# Patient Record
Sex: Male | Born: 1975 | Race: Black or African American | Hispanic: No | Marital: Single | Smoking: Never smoker
Health system: Southern US, Community
[De-identification: ages and names within clinical notes are randomized; demographics above are authoritative.]

## PROBLEM LIST (undated history)

## (undated) ENCOUNTER — Ambulatory Visit (HOSPITAL_COMMUNITY): Admission: EM | Payer: Medicare Other

## (undated) DIAGNOSIS — F840 Autistic disorder: Secondary | ICD-10-CM

## (undated) DIAGNOSIS — E669 Obesity, unspecified: Secondary | ICD-10-CM

## (undated) DIAGNOSIS — E785 Hyperlipidemia, unspecified: Secondary | ICD-10-CM

## (undated) DIAGNOSIS — G4730 Sleep apnea, unspecified: Secondary | ICD-10-CM

## (undated) DIAGNOSIS — I10 Essential (primary) hypertension: Secondary | ICD-10-CM

## (undated) DIAGNOSIS — E119 Type 2 diabetes mellitus without complications: Secondary | ICD-10-CM

## (undated) DIAGNOSIS — F418 Other specified anxiety disorders: Secondary | ICD-10-CM

---

## 2002-08-22 ENCOUNTER — Emergency Department (HOSPITAL_COMMUNITY): Admission: EM | Admit: 2002-08-22 | Discharge: 2002-08-22 | Payer: Self-pay | Admitting: Emergency Medicine

## 2014-05-08 ENCOUNTER — Encounter (HOSPITAL_BASED_OUTPATIENT_CLINIC_OR_DEPARTMENT_OTHER): Payer: Medicaid Other

## 2014-07-03 ENCOUNTER — Ambulatory Visit (HOSPITAL_BASED_OUTPATIENT_CLINIC_OR_DEPARTMENT_OTHER): Payer: Medicaid Other | Attending: Internal Medicine

## 2014-07-03 ENCOUNTER — Emergency Department (HOSPITAL_COMMUNITY): Admission: EM | Admit: 2014-07-03 | Payer: Medicaid Other | Source: Home / Self Care

## 2014-07-04 NOTE — Sleep Study (Unsigned)
The patient arrived to the Sleep center with his caregiver at 2030. The patient appeared loud, disruptive and agitated. The caregiver explained this was the patient's actions when presented to an unfamiliar environment. As I assessed the patient and talked with the caregiver, the patient continued to become more and more agitated and loud. The caregiver was asked how the patient would respond to touch and the caregiver's response was the patient might not let the technologist touch him, but the caregiver was there to keep him calm since he was familiar with the patient. With this given information, the sleep study was not performed due to the patient's current status. A Home Sleep test may be more beneficial as a baseline so that the patient will be in a familiar environment.

## 2015-10-27 ENCOUNTER — Ambulatory Visit (HOSPITAL_BASED_OUTPATIENT_CLINIC_OR_DEPARTMENT_OTHER): Payer: Medicare Other | Attending: Internal Medicine | Admitting: *Deleted

## 2015-10-27 VITALS — Ht 71.0 in | Wt 261.0 lb

## 2015-10-27 DIAGNOSIS — G4733 Obstructive sleep apnea (adult) (pediatric): Secondary | ICD-10-CM | POA: Insufficient documentation

## 2015-10-27 DIAGNOSIS — R0683 Snoring: Secondary | ICD-10-CM | POA: Diagnosis not present

## 2015-11-01 DIAGNOSIS — G4733 Obstructive sleep apnea (adult) (pediatric): Secondary | ICD-10-CM | POA: Diagnosis not present

## 2015-11-01 NOTE — Progress Notes (Signed)
Patient Name: Casey Fernandez, Casey Fernandez Date: 10/27/2015 Gender: Male D.O.B: 1976/05/23 Age (years): 39 Referring Provider: Nolene Ebbs Height (inches): 71 Interpreting Physician: Baird Lyons MD, ABSM Weight (lbs): 261 RPSGT: Joni Reining BMI: 78 MRN: [id number] Neck Size: 17.50 CLINICAL INFORMATION The patient is referred for a split night study with CPAP/BPAP.   MEDICATIONS Medications taken by the patient : charted for review Medications administered by patient during sleep study : No sleep medicine administered.  SLEEP STUDY TECHNIQUE As per the AASM Manual for the Scoring of Sleep and Associated Events v2.3 (April 2016) with a hypopnea requiring 4% desaturations. The channels recorded and monitored were frontal, central and occipital EEG, electrooculogram (EOG), submentalis EMG (chin), nasal and oral airflow, thoracic and abdominal wall motion, anterior tibialis EMG, snore microphone, electrocardiogram, and pulse oximetry. Bi-level positive airway pressure (BiPAP) was initiated when the patient met split night criteria and was titrated according to treat sleep-disordered breathing.  RESPIRATORY PARAMETERS Diagnostic Total AHI (/hr): 122.4 RDI (/hr): 122.4 OA Index (/hr): 119.6 CA Index (/hr): 0.0 REM AHI (/hr): N/A NREM AHI (/hr): 122.4 Supine AHI (/hr): 122.4 Non-supine AHI (/hr): N/A Min O2 Sat (%): 75.00 Mean O2 (%): 90.07 Time below 88% (min): 50.1     Titration Optimal IPAP Pressure (cm): 18 Optimal EPAP Pressure (cm): 12 AHI at Optimal Pressure (/hr): 64.4 Min O2 at Optimal Pressure (%): 83.0 Sleep % at Optimal (%): 87 Supine % at Optimal (%): 100          SLEEP ARCHITECTURE The study was initiated at 10:31:25 PM and terminated at 5:03:25 AM. The total recorded time was 392.0 minutes. EEG confirmed total sleep time was 251.0 minutes yielding a sleep efficiency of 64.0%. Sleep onset after lights out was 1.3 minutes with a REM latency of 387.0 minutes. The patient  spent 18.33% of the night in stage N1 sleep, 80.68% in stage N2 sleep, 0.00% in stage N3 and 1.00% in REM. Wake after sleep onset (WASO) was 139.7 minutes. The Arousal Index was 91.8/hour.  LEG MOVEMENT DATA The total Periodic Limb Movements of Sleep (PLMS) were 0. The PLMS index was 0.00 .  CARDIAC DATA The 2 lead EKG demonstrated sinus rhythm. The mean heart rate was 81.69 beats per minute. Other EKG findings include: None.  IMPRESSIONS - Patient unable to understand directions- see tech notes. Caregiver assisted. - Severe obstructive sleep apnea occurred during the diagnostic portion of the study (AHI = 122.4 /hour).  An optimal CPAP pressure was selected for this patient ( 18 / cm of water) for initial home trial. BiPAP was tried by tech. Suggest initial trial of CPAP at home. - No significant central sleep apnea occurred during the diagnostic portion of the study (CAI = 0.0/hour). - Moderate oxygen desaturation was noted during the diagnostic portion of the study (Min O2 = 75.00%). - The patient snored with Moderate snoring volume during the diagnostic portion of the study. - No cardiac abnormalities were noted during this study. - Clinically significant periodic limb movements of sleep did not occur during the study.  DIAGNOSIS - Obstructive Sleep Apnea (327.23 [G47.33 ICD-10])  RECOMMENDATIONS - Trial of CPAP therapy on 18/0 cm H2O with a Medium size Fisher[employer]Paykel Full Face Mask Simplus mask and heated humidification. - See Impressions, above. - Avoid alcohol, sedatives and other CNS depressants that may worsen sleep apnea and disrupt normal sleep architecture. - Sleep hygiene should be reviewed to assess factors that may improve sleep quality. - Weight management and regular exercise should be  initiated or continued.  Deneise Lever Diplomate, American Board of Sleep Medicine  ELECTRONICALLY SIGNED ON:  11/01/2015, 10:13 AM [facility] PH: [phone]  [phone]   FX: [phone] [place]

## 2019-07-25 ENCOUNTER — Other Ambulatory Visit: Payer: Self-pay

## 2019-07-25 ENCOUNTER — Inpatient Hospital Stay (HOSPITAL_COMMUNITY)
Admission: EM | Admit: 2019-07-25 | Discharge: 2019-07-31 | DRG: 208 | Disposition: A | Discharge status: Home / Self Care | Payer: Medicare Other | Attending: Internal Medicine | Admitting: Internal Medicine

## 2019-07-25 ENCOUNTER — Emergency Department (HOSPITAL_COMMUNITY): Payer: Medicare Other

## 2019-07-25 ENCOUNTER — Encounter (HOSPITAL_COMMUNITY): Payer: Self-pay | Admitting: Internal Medicine

## 2019-07-25 ENCOUNTER — Inpatient Hospital Stay (HOSPITAL_COMMUNITY): Payer: Medicare Other

## 2019-07-25 DIAGNOSIS — E872 Acidosis: Secondary | ICD-10-CM | POA: Diagnosis present

## 2019-07-25 DIAGNOSIS — E861 Hypovolemia: Secondary | ICD-10-CM | POA: Diagnosis present

## 2019-07-25 DIAGNOSIS — E878 Other disorders of electrolyte and fluid balance, not elsewhere classified: Secondary | ICD-10-CM | POA: Diagnosis present

## 2019-07-25 DIAGNOSIS — R1310 Dysphagia, unspecified: Secondary | ICD-10-CM | POA: Diagnosis present

## 2019-07-25 DIAGNOSIS — F329 Major depressive disorder, single episode, unspecified: Secondary | ICD-10-CM | POA: Diagnosis present

## 2019-07-25 DIAGNOSIS — E1165 Type 2 diabetes mellitus with hyperglycemia: Secondary | ICD-10-CM | POA: Diagnosis not present

## 2019-07-25 DIAGNOSIS — D6489 Other specified anemias: Secondary | ICD-10-CM | POA: Diagnosis present

## 2019-07-25 DIAGNOSIS — D638 Anemia in other chronic diseases classified elsewhere: Secondary | ICD-10-CM | POA: Diagnosis present

## 2019-07-25 DIAGNOSIS — Z452 Encounter for adjustment and management of vascular access device: Secondary | ICD-10-CM

## 2019-07-25 DIAGNOSIS — E785 Hyperlipidemia, unspecified: Secondary | ICD-10-CM | POA: Diagnosis present

## 2019-07-25 DIAGNOSIS — E870 Hyperosmolality and hypernatremia: Secondary | ICD-10-CM | POA: Diagnosis not present

## 2019-07-25 DIAGNOSIS — I119 Hypertensive heart disease without heart failure: Secondary | ICD-10-CM | POA: Diagnosis present

## 2019-07-25 DIAGNOSIS — G9341 Metabolic encephalopathy: Secondary | ICD-10-CM | POA: Diagnosis present

## 2019-07-25 DIAGNOSIS — U071 COVID-19: Secondary | ICD-10-CM | POA: Diagnosis present

## 2019-07-25 DIAGNOSIS — Z6837 Body mass index (BMI) 37.0-37.9, adult: Secondary | ICD-10-CM

## 2019-07-25 DIAGNOSIS — F418 Other specified anxiety disorders: Secondary | ICD-10-CM | POA: Diagnosis present

## 2019-07-25 DIAGNOSIS — N179 Acute kidney failure, unspecified: Secondary | ICD-10-CM | POA: Diagnosis present

## 2019-07-25 DIAGNOSIS — A419 Sepsis, unspecified organism: Secondary | ICD-10-CM | POA: Diagnosis not present

## 2019-07-25 DIAGNOSIS — Z781 Physical restraint status: Secondary | ICD-10-CM

## 2019-07-25 DIAGNOSIS — T380X5A Adverse effect of glucocorticoids and synthetic analogues, initial encounter: Secondary | ICD-10-CM | POA: Diagnosis not present

## 2019-07-25 DIAGNOSIS — G4733 Obstructive sleep apnea (adult) (pediatric): Secondary | ICD-10-CM | POA: Diagnosis present

## 2019-07-25 DIAGNOSIS — I959 Hypotension, unspecified: Secondary | ICD-10-CM | POA: Diagnosis present

## 2019-07-25 DIAGNOSIS — E871 Hypo-osmolality and hyponatremia: Secondary | ICD-10-CM | POA: Diagnosis present

## 2019-07-25 DIAGNOSIS — I10 Essential (primary) hypertension: Secondary | ICD-10-CM | POA: Diagnosis present

## 2019-07-25 DIAGNOSIS — F419 Anxiety disorder, unspecified: Secondary | ICD-10-CM | POA: Diagnosis present

## 2019-07-25 DIAGNOSIS — J9690 Respiratory failure, unspecified, unspecified whether with hypoxia or hypercapnia: Secondary | ICD-10-CM

## 2019-07-25 DIAGNOSIS — E669 Obesity, unspecified: Secondary | ICD-10-CM | POA: Diagnosis present

## 2019-07-25 DIAGNOSIS — Z794 Long term (current) use of insulin: Secondary | ICD-10-CM | POA: Diagnosis not present

## 2019-07-25 DIAGNOSIS — G4730 Sleep apnea, unspecified: Secondary | ICD-10-CM | POA: Diagnosis present

## 2019-07-25 DIAGNOSIS — E119 Type 2 diabetes mellitus without complications: Secondary | ICD-10-CM

## 2019-07-25 DIAGNOSIS — F840 Autistic disorder: Secondary | ICD-10-CM | POA: Diagnosis present

## 2019-07-25 DIAGNOSIS — J9601 Acute respiratory failure with hypoxia: Secondary | ICD-10-CM | POA: Diagnosis present

## 2019-07-25 DIAGNOSIS — Z79899 Other long term (current) drug therapy: Secondary | ICD-10-CM

## 2019-07-25 DIAGNOSIS — Z7984 Long term (current) use of oral hypoglycemic drugs: Secondary | ICD-10-CM

## 2019-07-25 DIAGNOSIS — J1289 Other viral pneumonia: Secondary | ICD-10-CM | POA: Diagnosis present

## 2019-07-25 DIAGNOSIS — R0902 Hypoxemia: Secondary | ICD-10-CM | POA: Diagnosis present

## 2019-07-25 DIAGNOSIS — J1282 Pneumonia due to coronavirus disease 2019: Secondary | ICD-10-CM

## 2019-07-25 HISTORY — DX: Other specified anxiety disorders: F41.8

## 2019-07-25 HISTORY — DX: Essential (primary) hypertension: I10

## 2019-07-25 HISTORY — DX: Obesity, unspecified: E66.9

## 2019-07-25 HISTORY — DX: Autistic disorder: F84.0

## 2019-07-25 HISTORY — DX: Sleep apnea, unspecified: G47.30

## 2019-07-25 HISTORY — DX: Type 2 diabetes mellitus without complications: E11.9

## 2019-07-25 HISTORY — DX: Hyperlipidemia, unspecified: E78.5

## 2019-07-25 LAB — COMPREHENSIVE METABOLIC PANEL
ALT: 48 U/L — ABNORMAL HIGH (ref 0–44)
AST: 50 U/L — ABNORMAL HIGH (ref 15–41)
Albumin: 3.2 g/dL — ABNORMAL LOW (ref 3.5–5.0)
Alkaline Phosphatase: 77 U/L (ref 38–126)
Anion gap: 10 (ref 5–15)
BUN: 45 mg/dL — ABNORMAL HIGH (ref 6–20)
CO2: 18 mmol/L — ABNORMAL LOW (ref 22–32)
Calcium: 8.6 mg/dL — ABNORMAL LOW (ref 8.9–10.3)
Chloride: 102 mmol/L (ref 98–111)
Creatinine, Ser: 3.16 mg/dL — ABNORMAL HIGH (ref 0.61–1.24)
GFR calc Af Amer: 27 mL/min — ABNORMAL LOW (ref >60)
GFR calc non Af Amer: 23 mL/min — ABNORMAL LOW (ref >60)
Glucose, Bld: 199 mg/dL — ABNORMAL HIGH (ref 70–99)
Potassium: 4 mmol/L (ref 3.5–5.1)
Sodium: 130 mmol/L — ABNORMAL LOW (ref 135–145)
Total Bilirubin: 0.7 mg/dL (ref 0.3–1.2)
Total Protein: 7.6 g/dL (ref 6.5–8.1)

## 2019-07-25 LAB — CBC WITH DIFFERENTIAL/PLATELET
Abs Immature Granulocytes: 0.05 10*3/uL (ref 0.00–0.07)
Basophils Absolute: 0 10*3/uL (ref 0.0–0.1)
Basophils Relative: 0 %
Eosinophils Absolute: 0 10*3/uL (ref 0.0–0.5)
Eosinophils Relative: 0 %
HCT: 42.8 % (ref 39.0–52.0)
Hemoglobin: 13.6 g/dL (ref 13.0–17.0)
Immature Granulocytes: 1 %
Lymphocytes Relative: 11 %
Lymphs Abs: 0.6 10*3/uL — ABNORMAL LOW (ref 0.7–4.0)
MCH: 30.2 pg (ref 26.0–34.0)
MCHC: 31.8 g/dL (ref 30.0–36.0)
MCV: 94.9 fL (ref 80.0–100.0)
Monocytes Absolute: 0.5 10*3/uL (ref 0.1–1.0)
Monocytes Relative: 9 %
Neutro Abs: 4.2 10*3/uL (ref 1.7–7.7)
Neutrophils Relative %: 79 %
Platelets: 194 10*3/uL (ref 150–400)
RBC: 4.51 MIL/uL (ref 4.22–5.81)
RDW: 13.1 % (ref 11.5–15.5)
WBC: 5.3 10*3/uL (ref 4.0–10.5)
nRBC: 0 % (ref 0.0–0.2)

## 2019-07-25 LAB — LIPID PANEL
Cholesterol: 99 mg/dL (ref 0–200)
HDL: 17 mg/dL — ABNORMAL LOW (ref >40)
LDL Cholesterol: 45 mg/dL (ref 0–99)
Total CHOL/HDL Ratio: 5.8 RATIO
Triglycerides: 186 mg/dL — ABNORMAL HIGH (ref <150)
VLDL: 37 mg/dL (ref 0–40)

## 2019-07-25 LAB — ABO/RH: ABO/RH(D): A POS

## 2019-07-25 LAB — LACTATE DEHYDROGENASE: LDH: 265 U/L — ABNORMAL HIGH (ref 98–192)

## 2019-07-25 LAB — PROCALCITONIN: Procalcitonin: 0.29 ng/mL

## 2019-07-25 LAB — HEMOGLOBIN A1C
Hgb A1c MFr Bld: 7.5 % — ABNORMAL HIGH (ref 4.8–5.6)
Mean Plasma Glucose: 168.55 mg/dL

## 2019-07-25 LAB — PROTIME-INR
INR: 1.1 (ref 0.8–1.2)
Prothrombin Time: 14.3 seconds (ref 11.4–15.2)

## 2019-07-25 LAB — APTT: aPTT: 33 seconds (ref 24–36)

## 2019-07-25 LAB — BRAIN NATRIURETIC PEPTIDE: B Natriuretic Peptide: 20 pg/mL (ref 0.0–100.0)

## 2019-07-25 LAB — TROPONIN I (HIGH SENSITIVITY): Troponin I (High Sensitivity): 40 ng/L — ABNORMAL HIGH (ref <18)

## 2019-07-25 LAB — LACTIC ACID, PLASMA
Lactic Acid, Venous: 1.4 mmol/L (ref 0.5–1.9)
Lactic Acid, Venous: 0.8 mmol/L (ref 0.5–1.9)

## 2019-07-25 LAB — FERRITIN: Ferritin: 294 ng/mL (ref 24–336)

## 2019-07-25 LAB — C-REACTIVE PROTEIN: CRP: 14.5 mg/dL — ABNORMAL HIGH (ref <1.0)

## 2019-07-25 LAB — TRIGLYCERIDES: Triglycerides: 193 mg/dL — ABNORMAL HIGH (ref <150)

## 2019-07-25 LAB — FIBRINOGEN: Fibrinogen: 728 mg/dL — ABNORMAL HIGH (ref 210–475)

## 2019-07-25 LAB — D-DIMER, QUANTITATIVE: D-Dimer, Quant: 1.37 ug/mL-FEU — ABNORMAL HIGH (ref 0.00–0.50)

## 2019-07-25 MED ORDER — INSULIN ASPART 100 UNIT/ML ~~LOC~~ SOLN
0.0000 [IU] | Freq: Every day | SUBCUTANEOUS | Status: DC
  Administered 2019-07-26: 2 [IU] via SUBCUTANEOUS

## 2019-07-25 MED ORDER — ONDANSETRON HCL 4 MG PO TABS: 4.0000 mg | ORAL_TABLET | Freq: Four times a day (QID) | ORAL | Status: DC | PRN

## 2019-07-25 MED ORDER — HYDROCOD POLST-CPM POLST ER 10-8 MG/5ML PO SUER: 5.0000 mL | Freq: Two times a day (BID) | ORAL | Status: DC | PRN

## 2019-07-25 MED ORDER — ACETAMINOPHEN 325 MG PO TABS
650.0000 mg | ORAL_TABLET | Freq: Four times a day (QID) | ORAL | Status: DC | PRN
  Administered 2019-07-25: 650 mg via ORAL
  Filled 2019-07-25: qty 2

## 2019-07-25 MED ORDER — ETOMIDATE 2 MG/ML IV SOLN: 35.0000 mg | Freq: Once | INTRAVENOUS | Status: DC

## 2019-07-25 MED ORDER — ACETAMINOPHEN 650 MG RE SUPP
650.0000 mg | Freq: Once | RECTAL | Status: AC
  Administered 2019-07-25: 650 mg via RECTAL
  Filled 2019-07-25: qty 1

## 2019-07-25 MED ORDER — METHYLPREDNISOLONE SODIUM SUCC 125 MG IJ SOLR
59.5000 mg | Freq: Two times a day (BID) | INTRAMUSCULAR | Status: DC
  Administered 2019-07-26: 59.5 mg via INTRAVENOUS
  Filled 2019-07-25: qty 2

## 2019-07-25 MED ORDER — SODIUM CHLORIDE 0.9 % IV SOLN
INTRAVENOUS | Status: DC
  Administered 2019-07-25: 20:00:00 via INTRAVENOUS
  Administered 2019-07-25: 1000 mL via INTRAVENOUS
  Administered 2019-07-26 – 2019-07-27 (×2): via INTRAVENOUS

## 2019-07-25 MED ORDER — FENTANYL CITRATE (PF) 100 MCG/2ML IJ SOLN
100.0000 ug | INTRAMUSCULAR | Status: DC | PRN
  Filled 2019-07-25 (×2): qty 2

## 2019-07-25 MED ORDER — GUAIFENESIN-DM 100-10 MG/5ML PO SYRP: 10.0000 mL | ORAL_SOLUTION | ORAL | Status: DC | PRN

## 2019-07-25 MED ORDER — SUCCINYLCHOLINE CHLORIDE 20 MG/ML IJ SOLN
176.0000 mg | Freq: Once | INTRAMUSCULAR | Status: DC
  Filled 2019-07-25: qty 8.8

## 2019-07-25 MED ORDER — ENOXAPARIN SODIUM 60 MG/0.6ML ~~LOC~~ SOLN
60.0000 mg | SUBCUTANEOUS | Status: DC
  Administered 2019-07-26 – 2019-07-30 (×6): 60 mg via SUBCUTANEOUS
  Filled 2019-07-25 (×6): qty 0.6

## 2019-07-25 MED ORDER — INSULIN ASPART 100 UNIT/ML ~~LOC~~ SOLN: 0.0000 [IU] | Freq: Three times a day (TID) | SUBCUTANEOUS | Status: DC

## 2019-07-25 MED ORDER — ETOMIDATE 2 MG/ML IV SOLN
INTRAVENOUS | Status: AC | PRN
  Administered 2019-07-25: 35.37 mg via INTRAVENOUS

## 2019-07-25 MED ORDER — PROPOFOL 1000 MG/100ML IV EMUL
5.0000 ug/kg/min | INTRAVENOUS | Status: DC
  Administered 2019-07-25: 23:00:00 5 ug/kg/min via INTRAVENOUS
  Administered 2019-07-26: 15 ug/kg/min via INTRAVENOUS
  Administered 2019-07-26: 30 ug/kg/min via INTRAVENOUS
  Administered 2019-07-26: 25 ug/kg/min via INTRAVENOUS
  Administered 2019-07-27: 30 ug/kg/min via INTRAVENOUS
  Filled 2019-07-25 (×4): qty 100

## 2019-07-25 MED ORDER — FENTANYL CITRATE (PF) 100 MCG/2ML IJ SOLN
100.0000 ug | INTRAMUSCULAR | Status: AC | PRN
  Administered 2019-07-25 – 2019-07-26 (×3): 100 ug via INTRAVENOUS
  Filled 2019-07-25 (×2): qty 2

## 2019-07-25 MED ORDER — VITAMIN C 500 MG PO TABS: 500.0000 mg | ORAL_TABLET | Freq: Every day | ORAL | Status: DC

## 2019-07-25 MED ORDER — SODIUM CHLORIDE 0.9 % IV BOLUS
1000.0000 mL | Freq: Once | INTRAVENOUS | Status: AC
  Administered 2019-07-25: 1000 mL via INTRAVENOUS

## 2019-07-25 MED ORDER — PROPOFOL 1000 MG/100ML IV EMUL
INTRAVENOUS | Status: AC
  Administered 2019-07-25: 5 ug/kg/min via INTRAVENOUS
  Filled 2019-07-25: qty 100

## 2019-07-25 MED ORDER — ZINC SULFATE 220 (50 ZN) MG PO CAPS
220.0000 mg | ORAL_CAPSULE | Freq: Every day | ORAL | Status: DC
  Filled 2019-07-25 (×2): qty 1

## 2019-07-25 MED ORDER — SUCCINYLCHOLINE CHLORIDE 20 MG/ML IJ SOLN
INTRAMUSCULAR | Status: AC | PRN
  Administered 2019-07-25: 176.85 mg via INTRAVENOUS

## 2019-07-25 MED ORDER — ONDANSETRON HCL 4 MG/2ML IJ SOLN: 4.0000 mg | Freq: Four times a day (QID) | INTRAMUSCULAR | Status: DC | PRN

## 2019-07-25 NOTE — ED Notes (Signed)
MD Vanita Panda made aware pt fighting the vent, got verbal orders to titrate up to 80 mcg on propofol and initiate soft restraints.

## 2019-07-25 NOTE — ED Notes (Signed)
Jarrett Soho Agricultural consultant, states Covid test has to be back first before we can transport.

## 2019-07-25 NOTE — ED Notes (Addendum)
Central monitor in room not working, IT aware and attempting to fix, charge nurse and PA aware.

## 2019-07-25 NOTE — ED Triage Notes (Signed)
Pt BIB [facility] from group home ([facility])  for known COVID+ test (07/19/19) w/ fever, chills, diarrhea, and cough for 2 days. Pt is nonverbal and caregiver rode in ambulance w/ pt. VSS, pt took Tylenol at noon. Caregiver, Kalman Shan, [phone]

## 2019-07-25 NOTE — H&P (Signed)
History and Physical    Casey Fernandez EXH:[id number] DOB: December 28, 1975 DOA: 07/25/2019  PCP: Patient, No Pcp Per  Patient coming from: Group home  I have personally briefly reviewed patient's old medical records in [facility]  Chief Complaint: Cough, fever, shortness of breath  HPI: Casey Fernandez is a 43 y.o. male with medical history significant of autism-he is nonverbal, hypertension, hyperlipidemia, diabetes, morbid obesity, sleep apnea, anxiety/depression presents to emergency department due to worsening cough, fever and shortness of breath.  Patient is nonverbal and he lives in a group home.  I called his caregiver Ms. Rose on this [phone] who is also medical decision maker for the patient.  Ms. Okey Dupre reports that patient was tested for COVID-19 on 07/19/2019 and results came back positive on 07/23/2019.  He continues to have high-grade fever-they have been giving him Tylenol as needed with little to no improvement.  Patient continues to have cough, shortness of breath, loose stools and decreased appetite associated with weakness.  Due to progression in his symptoms they called EMS and brought patient to the emergency department for further evaluation and management.  Patient has history of hypertension, diabetes, hyperlipidemia, anxiety/depression, autism, sleep apnea-he is compliant with his home medications.  No history of alcohol, smoking, illicit drug use.  ED Course: Upon arrival: Patient's was found to have a fever of 101.2, tachycardic, tachypneic, lactic acid: WNL.  Repeat COVID-19: Pending.  UA/UC/BC, procalcitonin level: Pending.  His inflammatory markers such as TEG, fibrinogen level, LDH and D-dimer: Elevated.  CMP shows AKI.  CBC shows no leukocytosis.  Chest x-ray shows pneumonia.  Patient was placed on 3 L of oxygen via nasal cannula as he was hypoxic.  Review of Systems: As per HPI otherwise negative.    Past Medical History:  Diagnosis Date  . Anxiety with  depression   . Autism   . DM (diabetes mellitus) (HCC)   . HLD (hyperlipidemia)   . HTN (hypertension)   . Obesity   . Sleep apnea     Past Surgical History:  Procedure Laterality Date  . Reviewed and found to be negative       reports that he has never smoked. He has never used smokeless tobacco. He reports that he does not drink alcohol or use drugs.  Not on File  Family History  Family history unknown: Yes    Prior to Admission medications   Medication Sig Start Date End Date Taking? Authorizing Provider  acetaminophen (TYLENOL) 325 MG tablet Take 650 mg by mouth every 6 (six) hours as needed for mild pain or fever.   Yes [provider]  atorvastatin (LIPITOR) 20 MG tablet Take 20 mg by mouth at bedtime. 07/11/19  Yes [provider]  clonazePAM (KLONOPIN) 1 MG tablet Take 1 mg by mouth 3 (three) times daily. 07/11/19  Yes [provider]  hydrOXYzine (ATARAX/VISTARIL) 10 MG tablet Take 10 mg by mouth 2 (two) times daily. 06/13/19  Yes [provider]  lithium carbonate (LITHOBID) 300 MG CR tablet Take 300-600 mg by mouth See admin instructions. Take 300mg  in the morning and 600mg  at bedtime. 07/04/19  Yes [provider]  losartan (COZAAR) 25 MG tablet Take 25 mg by mouth daily. 07/05/19  Yes [provider]  metFORMIN (GLUCOPHAGE) 500 MG tablet Take 500 mg by mouth 2 (two) times daily. 07/18/19  Yes [provider]  metoprolol (TOPROL-XL) 200 MG 24 hr tablet Take 200 mg by mouth daily. 07/18/19  Yes  [provider]  polyethylene glycol powder (GLYCOLAX/MIRALAX) 17 GM/SCOOP powder Take 17 g by mouth daily. 07/18/19  Yes [provider]  QUEtiapine (SEROQUEL XR) 300 MG 24 hr tablet Take 300 mg by mouth 2 (two) times daily. 06/27/19  Yes [provider]  topiramate (TOPAMAX) 100 MG tablet Take 100 mg by mouth 2 (two) times daily. 07/04/19  Yes [provider]    Physical Exam:  Vitals:   07/25/19 1453 07/25/19 1537 07/25/19 1538 07/25/19 1641  BP:   125/78 (!) 149/78  Pulse:   (!) 110 (!) 113  Resp:   (!) 34 (!) 30  Temp:  (!) 101.2 F (38.4 C)    TempSrc:  Rectal    SpO2: 92%  95% 96%    Constitutional: Appears comfortable, on 3 L of oxygen via nasal cannula.  Obese.  Nonverbal. Eyes: PERRL, lids and conjunctivae normal ENMT: Mucous membranes are moist. Posterior pharynx clear of any exudate or lesions.Normal dentition.  Neck: normal, supple, no masses, no thyromegaly Respiratory: clear to auscultation bilaterally, no wheezing, no crackles. Normal respiratory effort. No accessory muscle use.  Cardiovascular: Regular rate and rhythm, no murmurs / rubs / gallops. No extremity edema. 2+ pedal pulses. No carotid bruits.  Abdomen: no tenderness, no masses palpated. No hepatosplenomegaly. Bowel sounds positive.  Musculoskeletal: no clubbing / cyanosis. No joint deformity upper and lower extremities. Good ROM, no contractures. Normal muscle tone.  Skin: no rashes, lesions, ulcers. No induration Neurologic: Patient did not cooperate for neurological exam.   Labs on Admission: I have personally reviewed following labs and imaging studies  CBC: Recent Labs  Lab 07/25/19 1504  WBC 5.3  NEUTROABS 4.2  HGB 13.6  HCT 42.8  MCV 94.9  PLT 194   Basic Metabolic Panel: Recent Labs  Lab 07/25/19 1504  NA 130*  K 4.0  CL 102  CO2 18*  GLUCOSE 199*  BUN 45*  CREATININE 3.16*  CALCIUM 8.6*   GFR: CrCl cannot be calculated (Unknown ideal weight.). Liver Function Tests: Recent Labs  Lab 07/25/19 1504  AST 50*  ALT 48*  ALKPHOS 77  BILITOT 0.7  PROT 7.6  ALBUMIN 3.2*   No results for input(s): LIPASE, AMYLASE in the last 168 hours. No results for input(s): AMMONIA in the last 168 hours. Coagulation Profile: Recent Labs  Lab 07/25/19 1656  INR 1.1   Cardiac Enzymes: No results for input(s): CKTOTAL, CKMB, CKMBINDEX, TROPONINI in the last 168  hours. BNP (last 3 results) No results for input(s): PROBNP in the last 8760 hours. HbA1C: No results for input(s): HGBA1C in the last 72 hours. CBG: No results for input(s): GLUCAP in the last 168 hours. Lipid Profile: Recent Labs    07/25/19 1504  TRIG 193*   Thyroid Function Tests: No results for input(s): TSH, T4TOTAL, FREET4, T3FREE, THYROIDAB in the last 72 hours. Anemia Panel: Recent Labs    07/25/19 1504  FERRITIN 294   Urine analysis: No results found for: COLORURINE, APPEARANCEUR, LABSPEC, PHURINE, GLUCOSEU, HGBUR, BILIRUBINUR, KETONESUR, PROTEINUR, UROBILINOGEN, NITRITE, LEUKOCYTESUR  Radiological Exams on Admission: Dg Chest Port 1 View  Result Date: 07/25/2019 CLINICAL DATA:  Pt BIB [facility] from group home ([facility]) for known COVID+ test (07/19/19) w/ fever, chills, diarrhea, and cough for 2 days. Pt is nonverbal, best obtainable images d/t pt condition EXAM: PORTABLE CHEST 1 VIEW COMPARISON:  None. FINDINGS: Cardiac silhouette is partly obscured, but appears at least top normal in size to mildly enlarged. No mediastinal or  hilar masses. There is hazy opacity at the left lung base. A more subtle [place] opacity is suggested at the lateral right lung base. Remainder of the lungs is clear. No convincing pleural effusion.  No pneumothorax. Skeletal structures are grossly intact. IMPRESSION: 1. Hazy left lung base opacity which is concerning for pneumonia. There may be additional infiltrate at the right lateral lung base. Electronically Signed   By: Lajean Manes M.D.   On: 07/25/2019 16:39   Dg Abd Portable 1 View  Result Date: 07/25/2019 CLINICAL DATA:  From group home with known COVID positive test. Fever, chills, diarrhea and cough for 2 days. EXAM: PORTABLE ABDOMEN - 1 VIEW COMPARISON:  None. FINDINGS: Normal bowel gas pattern. Soft tissues are poorly defined due to the patient's body habitus. No convincing renal or ureteral stones. Soft skeletal structures are  unremarkable. IMPRESSION: Negative. Electronically Signed   By: Lajean Manes M.D.   On: 07/25/2019 16:40    Assessment/Plan Principal Problem:   Acute respiratory failure with hypoxia (HCC) Active Problems:   Pneumonia due to COVID-19 virus   AKI (acute kidney injury) ([place])   Essential hypertension   DM (diabetes mellitus) ([place])   HLD (hyperlipidemia)   Sleep apnea   Autism   Anxiety    Acute hypoxic respiratory failure: -Secondary to Covid pneumonia -Patient tachycardic, tachypneic, febrile of fever 101.2.  Lactic acid: WNL. -We will admit patient at progressive bed at [facility] for close monitoring. -On continuous pulse ox. -Inflammatory markers such as triceps is right, fibrinogen, LDH and D-dimer: Elevated. -Repeat COVID-19: Pending.  UA/UC/BC/procalcitonin pending. -We will start patient on IV Solu-Medrol. -Discussed with pharmacy regarding remdesivir-we will hold remdesivir for now due to severe AKI.  Monitor BMP closely and start tomorrow a.m. if improvement in renal function. -I discussed with patient's caregiver Ms. Rose that if COVID-19 pneumonitis gets worse we might potentially use Actemra off label, she denies any known history of tuberculosis or hepatitis and understands the risk and benefits and wants to proceed with Actemra treatment if required. -Repeat inflammatory markers tomorrow AM. -Start on vitamins.  AKI: -No baseline BUN/creatinine in records -Start on IV fluids and monitor kidney function closely. -Hold Metformin and losartan for now. Laurey Arrow BMP tomorrow a.m.  Diabetes mellitus: Check A1c -Hold Metformin and start on sliding scale insulin. -Monitor blood sugar closely.  Hypertension: Blood pressure is currently stable -We will continue metoprolol and hold losartan -Monitor blood pressure closely.  Hyperlipidemia: Check lipid panel -Continue statin.  Autism: -Patient is nonverbal at baseline  Anxiety/depression/mood disorder: We  will resume his home meds.  DVT prophylaxis: TED/SCD/Lovenox Code Status: Full code-confirmed with caregiver Family Communication: None present at bedside.  Plan of care discussed with patient's caregiver Ms. Rose on the phone in length and she verbalized understanding and agreed with it. Disposition Plan: TBD Consults called: None Admission status: Inpatient at [place] MD [facility] Pager [phone]  If 7PM-7AM, please contact night-coverage [email or web address] Password [facility]  07/25/2019, 6:05 PM

## 2019-07-25 NOTE — ED Provider Notes (Addendum)
[facility] EMERGENCY DEPARTMENT Provider Note   CSN: [id number] Arrival date & time: 07/25/19  1448    History   Chief Complaint Chief Complaint  Patient presents with   Fever   COVID +   HPI Casey Fernandez is a 43 y.o. male known Covid positive ([facility]) presents for evaluation of fever, chills, cough and diarrhea.  He tested positive for Covid on 07/19/2019. They have been giving Tylenol for his fever however he has had persistent fever even with Tylenol. Nonverbal at baseline and is unable to provide history.  Level 5 caveat-nonverbal    1610: I tried to contact Rose, caregiver at [phone] unable to reach for collateral information.  1711: Rose, states patient dx with Covid last Friday.  Has had progressive worsening of his cough.  Had difficulty breathing with shortness of breath earlier today.  Televisit with PCP who recommended to come into the emergency department for evaluation for possible admission. HPI  Past Medical History:  Diagnosis Date   Anxiety with depression    Autism    DM (diabetes mellitus) (HCC)    HLD (hyperlipidemia)    HTN (hypertension)    Obesity    Sleep apnea     Patient Active Problem List   Diagnosis Date Noted   Acute respiratory failure with hypoxia (HCC) 07/25/2019   Pneumonia due to COVID-19 virus 07/25/2019   AKI (acute kidney injury) (HCC) 07/25/2019   Essential hypertension 07/25/2019   DM (diabetes mellitus) (HCC) 07/25/2019   HLD (hyperlipidemia) 07/25/2019   Sleep apnea 07/25/2019   Autism 07/25/2019   Anxiety 07/25/2019    History reviewed      Home Medications    Prior to Admission medications   Medication Sig Start Date End Date Taking? Authorizing Provider  acetaminophen (TYLENOL) 325 MG tablet Take 650 mg by mouth every 6 (six) hours as needed for mild pain or fever.   Yes [provider]  atorvastatin (LIPITOR) 20 MG tablet Take 20 mg by mouth at bedtime.  07/11/19  Yes [provider]  clonazePAM (KLONOPIN) 1 MG tablet Take 1 mg by mouth 3 (three) times daily. 07/11/19  Yes [provider]  hydrOXYzine (ATARAX/VISTARIL) 10 MG tablet Take 10 mg by mouth 2 (two) times daily. 06/13/19  Yes [provider]  lithium carbonate (LITHOBID) 300 MG CR tablet Take 300-600 mg by mouth See admin instructions. Take  in the morning and  at bedtime. 07/04/19  Yes [provider]  losartan (COZAAR) 25 MG tablet Take 25 mg by mouth daily. 07/05/19  Yes [provider]  metFORMIN (GLUCOPHAGE) 500 MG tablet Take 500 mg by mouth 2 (two) times daily. 07/18/19  Yes [provider]  metoprolol (TOPROL-XL) 200 MG 24 hr tablet Take 200 mg by mouth daily. 07/18/19  Yes [provider]  polyethylene glycol powder (GLYCOLAX/MIRALAX) 17 GM/SCOOP powder Take 17 g by mouth daily. 07/18/19  Yes [provider]  QUEtiapine (SEROQUEL XR) 300 MG 24 hr tablet Take 300 mg by mouth 2 (two) times daily. 06/27/19  Yes [provider]  topiramate (TOPAMAX) 100 MG tablet Take 100 mg by mouth 2 (two) times daily. 07/04/19  Yes [provider]    Family History Family History  Family history unknown: Yes    Social History Social History   Tobacco Use   Smoking status: Never Smoker   Smokeless tobacco: Never Used  Substance Use Topics   Alcohol use: Never    Frequency:  Never   Drug use: Never     Allergies   Patient has no allergy information on record.   Review of Systems Review of Systems  Unable to perform ROS: Patient nonverbal  Constitutional: Positive for fever.  Respiratory: Positive for cough and shortness of breath.   Gastrointestinal: Positive for diarrhea.     Physical Exam Updated Vital Signs BP (!) 149/78 (BP Location: Left Arm)    Pulse (!) 113    Temp (!) 101.2 F (38.4 C) (Rectal)    Resp (!) 30    SpO2 96%   Physical Exam Vitals signs and nursing  note reviewed.  Constitutional:      General: He is not in acute distress.    Appearance: He is well-developed. He is ill-appearing. He is not diaphoretic.     Comments: Patient appears ill.  HENT:     Head: Atraumatic.     Comments: Thickened mucous membranes and oropharynx. Uvula midline. Eyes:     Pupils: Pupils are equal, round, and reactive to light.     Comments: Pupils equal reactive to light  Neck:     Musculoskeletal: Full passive range of motion without pain, normal range of motion and neck supple.     Comments: No neck stiffness or neck rigidity. Cardiovascular:     Rate and Rhythm: Regular rhythm. Tachycardia present.     Comments: Tacycardic to 115 Pulmonary:     Effort: Pulmonary effort is normal. No respiratory distress.     Breath sounds: Decreased breath sounds present.     Comments: Tachypneic Abdominal:     General: There is no distension.     Palpations: Abdomen is soft.     Comments: Distended abdomen, nontender  Musculoskeletal: Normal range of motion.     Comments: Moves all 4 extremities without difficulty  Skin:    General: Skin is warm and dry.     Comments: Brisk capillary refill.  No rashes or lesions  Neurological:     Mental Status: He is alert.     Comments: Nonverbal.    ED Treatments / Results  Labs (all labs ordered are listed, but only abnormal results are displayed) Labs Reviewed  CBC WITH DIFFERENTIAL/PLATELET - Abnormal; Notable for the following components:      Result Value   Lymphs Abs 0.6 (*)    All other components within normal limits  COMPREHENSIVE METABOLIC PANEL - Abnormal; Notable for the following components:   Sodium 130 (*)    CO2 18 (*)    Glucose, Bld 199 (*)    BUN 45 (*)    Creatinine, Ser 3.16 (*)    Calcium 8.6 (*)    Albumin 3.2 (*)    AST 50 (*)    ALT 48 (*)    GFR calc non Af Amer 23 (*)    GFR calc Af Amer 27 (*)    All other components within normal limits  D-DIMER, QUANTITATIVE (NOT AT [facility]) -  Abnormal; Notable for the following components:   D-Dimer, Quant 1.37 (*)    All other components within normal limits  LACTATE DEHYDROGENASE - Abnormal; Notable for the following components:   LDH 265 (*)    All other components within normal limits  FIBRINOGEN - Abnormal; Notable for the following components:   Fibrinogen 728 (*)    All other components within normal limits  C-REACTIVE PROTEIN - Abnormal; Notable for the following components:   CRP 14.5 (*)    All other components  within normal limits  TRIGLYCERIDES - Abnormal; Notable for the following components:   Triglycerides 193 (*)    All other components within normal limits  CULTURE, BLOOD (ROUTINE X 2)  CULTURE, BLOOD (ROUTINE X 2)  URINE CULTURE  SARS CORONAVIRUS 2 (TAT 6-24 HRS)  LACTIC ACID, PLASMA  PROCALCITONIN  FERRITIN  APTT  PROTIME-INR  LACTIC ACID, PLASMA  URINALYSIS, ROUTINE W REFLEX MICROSCOPIC  HIV ANTIBODY (ROUTINE TESTING W REFLEX)  HEPATITIS B SURFACE ANTIGEN  BRAIN NATRIURETIC PEPTIDE  CBC WITH DIFFERENTIAL/PLATELET  COMPREHENSIVE METABOLIC PANEL  C-REACTIVE PROTEIN  D-DIMER, QUANTITATIVE (NOT AT [facility])  FERRITIN  MAGNESIUM  PHOSPHORUS  HEMOGLOBIN A1C  LIPID PANEL  LITHIUM LEVEL  ABO/RH  TROPONIN I (HIGH SENSITIVITY)    EKG None  Radiology Dg Chest Port 1 View  Result Date: 07/25/2019 CLINICAL DATA:  Pt BIB [facility] from group home ([facility]) for known COVID+ test (07/19/19) w/ fever, chills, diarrhea, and cough for 2 days. Pt is nonverbal, best obtainable images d/t pt condition EXAM: PORTABLE CHEST 1 VIEW COMPARISON:  None. FINDINGS: Cardiac silhouette is partly obscured, but appears at least top normal in size to mildly enlarged. No mediastinal or hilar masses. There is hazy opacity at the left lung base. A more subtle [place] opacity is suggested at the lateral right lung base. Remainder of the lungs is clear. No convincing pleural effusion.  No pneumothorax. Skeletal structures are grossly  intact. IMPRESSION: 1. Hazy left lung base opacity which is concerning for pneumonia. There may be additional infiltrate at the right lateral lung base. Electronically Signed   By: Amie Portland M.D.   On: 07/25/2019 16:39   Dg Abd Portable 1 View  Result Date: 07/25/2019 CLINICAL DATA:  From group home with known COVID positive test. Fever, chills, diarrhea and cough for 2 days. EXAM: PORTABLE ABDOMEN - 1 VIEW COMPARISON:  None. FINDINGS: Normal bowel gas pattern. Soft tissues are poorly defined due to the patient's body habitus. No convincing renal or ureteral stones. Soft skeletal structures are unremarkable. IMPRESSION: Negative. Electronically Signed   By: Amie Portland M.D.   On: 07/25/2019 16:40    Procedures .Critical Care Performed by: Linwood Dibbles, PA-C Authorized by: Linwood Dibbles, PA-C   Critical care provider statement:    Critical care time (minutes):  45   Critical care was necessary to treat or prevent imminent or life-threatening deterioration of the following conditions:  Sepsis and respiratory failure   Critical care was time spent personally by me on the following activities:  Discussions with consultants, evaluation of patient's response to treatment, examination of patient, ordering and performing treatments and interventions, ordering and review of laboratory studies, ordering and review of radiographic studies, pulse oximetry, re-evaluation of patient's condition, obtaining history from patient or surrogate, review of old charts and development of treatment plan with patient or surrogate   (including critical care time)  Medications Ordered in ED Medications  enoxaparin (LOVENOX) injection 30 mg (has no administration in time range)  methylPREDNISolone sodium succinate (SOLU-MEDROL) injection 0.5 mg/kg (has no administration in time range)  acetaminophen (TYLENOL) tablet 650 mg (has no administration in time range)  ondansetron (ZOFRAN) tablet 4 mg (has no  administration in time range)    Or  ondansetron (ZOFRAN) injection 4 mg (has no administration in time range)  vitamin C (ASCORBIC ACID) tablet 500 mg (has no administration in time range)  zinc sulfate capsule 220 mg (has no administration in time range)  guaiFENesin-dextromethorphan (ROBITUSSIN DM)  100-10 MG/5ML syrup 10 mL (has no administration in time range)  chlorpheniramine-HYDROcodone (TUSSIONEX) 10-8 MG/5ML suspension 5 mL (has no administration in time range)  sodium chloride 0.9 % bolus 1,000 mL (0 mLs Intravenous Stopped 07/25/19 1758)  acetaminophen (TYLENOL) suppository 650 mg (650 mg Rectal Given 07/25/19 1626)   Initial Impression / Assessment and Plan / ED Course  I have reviewed the triage vital signs and the nursing notes.  Pertinent labs & imaging results that were available during my care of the patient were reviewed by me and considered in my medical decision making (see chart for details).  43 year old known COVID + presents with worsening fever, cough, diarrhea.  He is nonverbal at baseline.  Initial evaluation patient febrile, tachycardic, tachypneic with room saturations 90% on room air which he desaturates into the 80s with minimal movement.  He was placed on 3 L oxygen via nasal cannula with oxygen saturation to 95%.  Abdomen distended however nontender.  Facility has been giving Tylenol every 4 hours however he is still having a fever despite Tylenol use.  Plan for labs, IV fluids, Tylenol.  Patient will need admission.  Will hold on antibiotics given he has known Covid infection.  Symptoms likely related to Covid infection.  He does have an AKI however no labs to compare.  Will give fluid bolus. CBC without leukocytosis, hemoglobin 98.3 Metabolic panel with hyponatremia, elevated glucose at 199, creatinine 3.16 Plain film chest with pneumonia, likely viral pneumonia will hold on antibiotics.  Plain abdomen without acute findings.  Discuss patient's plan and care  with Rose, patient's caregiver.  She is in agreement.  All questions answered.  Patient reevaluated.  Labs and imaging personally reviewed and interpreted. His oxygen saturation 96% on 3 L however he continues to be tachycardic, tachypneic.  He is protecting his airway does not appear to be in acute respiratory distress. Will consult hospitalist to admit.  Consulted with Dr. Doristine Bosworth with [facility] who will evaluate patient for admission for hypoxic respiratory failure, sepsis, likely due to known Covid viral pneumonia as well as AKI.  Cardiac monitor is not working in patients room.  I have discussed with charge nurse to move patient to a monitored bed given his on oxygen and septic.  Charge nurse as well as floor nurse aware of need for monitored bed.  Patient discussed with attending physician, Dr. Vanita Panda who agrees with above treatment, plan and disposition.   The patient appears reasonably stabilized for admission considering the current resources, flow, and capabilities available in the ED at this time, and I doubt any other [facility] requiring further screening and/or treatment in the ED prior to admission.    Final Clinical Impressions(s) / ED Diagnoses   Final diagnoses:  Sepsis without acute organ dysfunction, due to unspecified organism [facility])  COVID-19  Hypoxic  AKI (acute kidney injury) [facility])    ED Discharge Orders    None       Sydney Hasten A, PA-C 07/25/19 1809    Odeal Welden A, PA-C 07/25/19 1837    Carmin Muskrat, MD 07/25/19 2355

## 2019-07-25 NOTE — Progress Notes (Signed)
Spoke with Janett Billow RN at [facility]; stated we needed a positive result prior to accepting patient. Sood MD @ [facility] to get in touch with Hulen Skains MD in regards to transfer and placing order for rapid COVID test.

## 2019-07-25 NOTE — ED Notes (Signed)
Pt found breathing 60 bpm, O2 sat 88% on 5L [place]. RN put patient on NRB, pt now breathing 40 bpm, O2 sat 99% on NRB.

## 2019-07-25 NOTE — ED Notes (Signed)
Admitting phys and AC made aware that pt has been intubated.

## 2019-07-25 NOTE — ED Notes (Signed)
RN unable to obtain second set of blood cultures after 3 attempts. Provider made aware.

## 2019-07-25 NOTE — ED Notes (Signed)
ETT 7.5 at 25

## 2019-07-26 ENCOUNTER — Inpatient Hospital Stay (HOSPITAL_COMMUNITY): Payer: Medicare Other

## 2019-07-26 DIAGNOSIS — U071 COVID-19: Secondary | ICD-10-CM | POA: Diagnosis present

## 2019-07-26 LAB — POCT I-STAT 7, (LYTES, BLD GAS, ICA,H+H)
Acid-base deficit: 11 mmol/L — ABNORMAL HIGH (ref 0.0–2.0)
Acid-base deficit: 12 mmol/L — ABNORMAL HIGH (ref 0.0–2.0)
Acid-base deficit: 13 mmol/L — ABNORMAL HIGH (ref 0.0–2.0)
Bicarbonate: 13.1 mmol/L — ABNORMAL LOW (ref 20.0–28.0)
Bicarbonate: 15 mmol/L — ABNORMAL LOW (ref 20.0–28.0)
Bicarbonate: 15.4 mmol/L — ABNORMAL LOW (ref 20.0–28.0)
Calcium, Ion: 1.16 mmol/L (ref 1.15–1.40)
Calcium, Ion: 1.18 mmol/L (ref 1.15–1.40)
Calcium, Ion: 1.24 mmol/L (ref 1.15–1.40)
HCT: 26 % — ABNORMAL LOW (ref 39.0–52.0)
HCT: 28 % — ABNORMAL LOW (ref 39.0–52.0)
HCT: 29 % — ABNORMAL LOW (ref 39.0–52.0)
Hemoglobin: 8.8 g/dL — ABNORMAL LOW (ref 13.0–17.0)
Hemoglobin: 9.5 g/dL — ABNORMAL LOW (ref 13.0–17.0)
Hemoglobin: 9.9 g/dL — ABNORMAL LOW (ref 13.0–17.0)
O2 Saturation: 100 %
O2 Saturation: 96 %
O2 Saturation: 97 %
Patient temperature: 97.9
Patient temperature: 98.6
Patient temperature: 98.7
Potassium: 4.2 mmol/L (ref 3.5–5.1)
Potassium: 4.9 mmol/L (ref 3.5–5.1)
Potassium: 5 mmol/L (ref 3.5–5.1)
Sodium: 135 mmol/L (ref 135–145)
Sodium: 139 mmol/L (ref 135–145)
Sodium: 141 mmol/L (ref 135–145)
TCO2: 14 mmol/L — ABNORMAL LOW (ref 22–32)
TCO2: 16 mmol/L — ABNORMAL LOW (ref 22–32)
TCO2: 17 mmol/L — ABNORMAL LOW (ref 22–32)
pCO2 arterial: 29.8 mmHg — ABNORMAL LOW (ref 32.0–48.0)
pCO2 arterial: 36.7 mmHg (ref 32.0–48.0)
pCO2 arterial: 37.5 mmHg (ref 32.0–48.0)
pH, Arterial: 7.211 — ABNORMAL LOW (ref 7.350–7.450)
pH, Arterial: 7.23 — ABNORMAL LOW (ref 7.350–7.450)
pH, Arterial: 7.25 — ABNORMAL LOW (ref 7.350–7.450)
pO2, Arterial: 106 mmHg (ref 83.0–108.0)
pO2, Arterial: 280 mmHg — ABNORMAL HIGH (ref 83.0–108.0)
pO2, Arterial: 95 mmHg (ref 83.0–108.0)

## 2019-07-26 LAB — HEPATITIS B SURFACE ANTIGEN: Hepatitis B Surface Ag: NONREACTIVE

## 2019-07-26 LAB — URINALYSIS, ROUTINE W REFLEX MICROSCOPIC
Bacteria, UA: NONE SEEN
Bilirubin Urine: NEGATIVE
Glucose, UA: NEGATIVE mg/dL
Ketones, ur: NEGATIVE mg/dL
Leukocytes,Ua: NEGATIVE
Nitrite: NEGATIVE
Protein, ur: NEGATIVE mg/dL
Specific Gravity, Urine: 1.006 (ref 1.005–1.030)
pH: 6 (ref 5.0–8.0)

## 2019-07-26 LAB — LITHIUM LEVEL: Lithium Lvl: 1.35 mmol/L — ABNORMAL HIGH (ref 0.60–1.20)

## 2019-07-26 LAB — HIV ANTIBODY (ROUTINE TESTING W REFLEX): HIV Screen 4th Generation wRfx: NONREACTIVE

## 2019-07-26 LAB — PHOSPHORUS: Phosphorus: 1.1 mg/dL — ABNORMAL LOW (ref 2.5–4.6)

## 2019-07-26 LAB — GLUCOSE, CAPILLARY
Glucose-Capillary: 332 mg/dL — ABNORMAL HIGH (ref 70–99)
Glucose-Capillary: 384 mg/dL — ABNORMAL HIGH (ref 70–99)
Glucose-Capillary: 340 mg/dL — ABNORMAL HIGH (ref 70–99)
Glucose-Capillary: 333 mg/dL — ABNORMAL HIGH (ref 70–99)
Glucose-Capillary: 370 mg/dL — ABNORMAL HIGH (ref 70–99)
Glucose-Capillary: 385 mg/dL — ABNORMAL HIGH (ref 70–99)

## 2019-07-26 LAB — D-DIMER, QUANTITATIVE: D-Dimer, Quant: 1.16 ug/mL-FEU — ABNORMAL HIGH (ref 0.00–0.50)

## 2019-07-26 LAB — MAGNESIUM: Magnesium: 2.2 mg/dL (ref 1.7–2.4)

## 2019-07-26 LAB — SARS CORONAVIRUS 2 (TAT 6-24 HRS): SARS Coronavirus 2: POSITIVE — AB

## 2019-07-26 LAB — CBG MONITORING, ED: Glucose-Capillary: 222 mg/dL — ABNORMAL HIGH (ref 70–99)

## 2019-07-26 LAB — MRSA PCR SCREENING: MRSA by PCR: NEGATIVE

## 2019-07-26 MED ORDER — QUETIAPINE FUMARATE 50 MG PO TABS: 200.0000 mg | ORAL_TABLET | Freq: Two times a day (BID) | ORAL | Status: DC

## 2019-07-26 MED ORDER — SODIUM CHLORIDE 0.9 % IV SOLN
250.0000 mL | INTRAVENOUS | Status: DC
  Administered 2019-07-26: 250 mL via INTRAVENOUS

## 2019-07-26 MED ORDER — SODIUM CHLORIDE 0.9 % IV SOLN
200.0000 mg | Freq: Once | INTRAVENOUS | Status: AC
  Administered 2019-07-26: 200 mg via INTRAVENOUS
  Filled 2019-07-26: qty 40

## 2019-07-26 MED ORDER — METHYLPREDNISOLONE SODIUM SUCC 125 MG IJ SOLR
60.0000 mg | Freq: Two times a day (BID) | INTRAMUSCULAR | Status: DC
  Administered 2019-07-26: 60 mg via INTRAVENOUS
  Filled 2019-07-26: qty 2

## 2019-07-26 MED ORDER — INSULIN DETEMIR 100 UNIT/ML ~~LOC~~ SOLN
0.1500 [IU]/kg | Freq: Two times a day (BID) | SUBCUTANEOUS | Status: DC
  Administered 2019-07-26: 18 [IU] via SUBCUTANEOUS
  Filled 2019-07-26 (×2): qty 0.18

## 2019-07-26 MED ORDER — ATORVASTATIN CALCIUM 10 MG PO TABS: 20.0000 mg | ORAL_TABLET | Freq: Every day | ORAL | Status: DC

## 2019-07-26 MED ORDER — TOPIRAMATE 100 MG PO TABS
100.0000 mg | ORAL_TABLET | Freq: Two times a day (BID) | ORAL | Status: DC
  Administered 2019-07-26 – 2019-07-27 (×4): 100 mg
  Filled 2019-07-26 (×5): qty 1

## 2019-07-26 MED ORDER — SODIUM CHLORIDE 0.9 % IV SOLN
500.0000 mg | INTRAVENOUS | Status: AC
  Administered 2019-07-26 – 2019-07-29 (×4): 500 mg via INTRAVENOUS
  Filled 2019-07-26 (×4): qty 500

## 2019-07-26 MED ORDER — CHLORHEXIDINE GLUCONATE 0.12 % MT SOLN
15.0000 mL | Freq: Two times a day (BID) | OROMUCOSAL | Status: DC
  Administered 2019-07-26 – 2019-07-31 (×10): 15 mL via OROMUCOSAL
  Filled 2019-07-26 (×7): qty 15

## 2019-07-26 MED ORDER — SODIUM PHOSPHATES 45 MMOLE/15ML IV SOLN
10.0000 mmol | Freq: Once | INTRAVENOUS | Status: AC
  Administered 2019-07-26: 10 mmol via INTRAVENOUS
  Filled 2019-07-26: qty 3.33

## 2019-07-26 MED ORDER — SODIUM CHLORIDE 0.9 % IV SOLN
2.0000 g | INTRAVENOUS | Status: AC
  Administered 2019-07-26 – 2019-07-29 (×4): 2 g via INTRAVENOUS
  Filled 2019-07-26 (×4): qty 20

## 2019-07-26 MED ORDER — INSULIN REGULAR(HUMAN) IN NACL 100-0.9 UT/100ML-% IV SOLN
INTRAVENOUS | Status: DC
  Administered 2019-07-26: 3.1 [IU]/h via INTRAVENOUS
  Administered 2019-07-27: 13.1 [IU]/h via INTRAVENOUS
  Administered 2019-07-28: 2.1 [IU]/h via INTRAVENOUS
  Filled 2019-07-26 (×3): qty 100

## 2019-07-26 MED ORDER — VITAMIN C 500 MG PO TABS
500.0000 mg | ORAL_TABLET | Freq: Every day | ORAL | Status: DC
  Administered 2019-07-26 – 2019-07-27 (×2): 500 mg
  Filled 2019-07-26 (×2): qty 1

## 2019-07-26 MED ORDER — CHLORHEXIDINE GLUCONATE CLOTH 2 % EX PADS
6.0000 | MEDICATED_PAD | Freq: Every day | CUTANEOUS | Status: DC
  Administered 2019-07-26 – 2019-07-27 (×3): 6 via TOPICAL

## 2019-07-26 MED ORDER — ZINC SULFATE 220 (50 ZN) MG PO CAPS
220.0000 mg | ORAL_CAPSULE | Freq: Every day | ORAL | Status: DC
  Administered 2019-07-26 – 2019-07-27 (×2): 220 mg
  Filled 2019-07-26 (×2): qty 1

## 2019-07-26 MED ORDER — TOPIRAMATE 100 MG PO TABS: 100.0000 mg | ORAL_TABLET | Freq: Two times a day (BID) | ORAL | Status: DC

## 2019-07-26 MED ORDER — PHENYLEPHRINE HCL-NACL 10-0.9 MG/250ML-% IV SOLN
25.0000 ug/min | INTRAVENOUS | Status: DC
  Administered 2019-07-26: 25 ug/min via INTRAVENOUS
  Filled 2019-07-26: qty 250

## 2019-07-26 MED ORDER — CLONAZEPAM 1 MG PO TABS: 1.0000 mg | ORAL_TABLET | Freq: Three times a day (TID) | ORAL | Status: DC

## 2019-07-26 MED ORDER — VITAL HIGH PROTEIN PO LIQD
1000.0000 mL | ORAL | Status: DC
  Administered 2019-07-26 – 2019-07-27 (×2): 1000 mL

## 2019-07-26 MED ORDER — CLONAZEPAM 1 MG PO TABS
1.0000 mg | ORAL_TABLET | Freq: Three times a day (TID) | ORAL | Status: DC
  Administered 2019-07-26 – 2019-07-28 (×6): 1 mg
  Filled 2019-07-26 (×6): qty 1

## 2019-07-26 MED ORDER — PHENYLEPHRINE HCL-NACL 10-0.9 MG/250ML-% IV SOLN
0.0000 ug/min | INTRAVENOUS | Status: DC
  Filled 2019-07-26 (×2): qty 250

## 2019-07-26 MED ORDER — PRO-STAT SUGAR FREE PO LIQD
60.0000 mL | Freq: Three times a day (TID) | ORAL | Status: DC
  Administered 2019-07-26 – 2019-07-27 (×5): 60 mL
  Filled 2019-07-26 (×5): qty 60

## 2019-07-26 MED ORDER — MUPIROCIN 2 % EX OINT
1.0000 "application " | TOPICAL_OINTMENT | Freq: Two times a day (BID) | CUTANEOUS | Status: AC
  Administered 2019-07-26 – 2019-07-30 (×10): 1 via NASAL
  Filled 2019-07-26 (×3): qty 22

## 2019-07-26 MED ORDER — METHYLPREDNISOLONE SODIUM SUCC 40 MG IJ SOLR
40.0000 mg | Freq: Two times a day (BID) | INTRAMUSCULAR | Status: DC
  Administered 2019-07-26: 40 mg via INTRAVENOUS
  Filled 2019-07-26 (×2): qty 1

## 2019-07-26 MED ORDER — PRO-STAT SUGAR FREE PO LIQD
30.0000 mL | Freq: Two times a day (BID) | ORAL | Status: DC
  Administered 2019-07-26: 30 mL
  Filled 2019-07-26: qty 30

## 2019-07-26 MED ORDER — PROPOFOL 1000 MG/100ML IV EMUL
INTRAVENOUS | Status: AC
  Filled 2019-07-26: qty 100

## 2019-07-26 MED ORDER — INSULIN ASPART 100 UNIT/ML ~~LOC~~ SOLN
0.0000 [IU] | SUBCUTANEOUS | Status: DC
  Administered 2019-07-26: 20 [IU] via SUBCUTANEOUS
  Administered 2019-07-26 (×3): 15 [IU] via SUBCUTANEOUS

## 2019-07-26 MED ORDER — CHLORHEXIDINE GLUCONATE 0.12% ORAL RINSE (MEDLINE KIT)
15.0000 mL | Freq: Two times a day (BID) | OROMUCOSAL | Status: DC
  Administered 2019-07-26: 15 mL via OROMUCOSAL

## 2019-07-26 MED ORDER — SODIUM CHLORIDE 0.9 % IV SOLN
100.0000 mg | INTRAVENOUS | Status: AC
  Administered 2019-07-27 – 2019-07-30 (×4): 100 mg via INTRAVENOUS
  Filled 2019-07-26 (×4): qty 100

## 2019-07-26 MED ORDER — ASPIRIN EC 81 MG PO TBEC
81.0000 mg | DELAYED_RELEASE_TABLET | Freq: Every day | ORAL | Status: DC
  Administered 2019-07-26: 81 mg via ORAL
  Filled 2019-07-26 (×2): qty 1

## 2019-07-26 MED ORDER — INSULIN ASPART 100 UNIT/ML ~~LOC~~ SOLN: 2.0000 [IU] | SUBCUTANEOUS | Status: DC

## 2019-07-26 MED ORDER — ACETAMINOPHEN 325 MG PO TABS: 650.0000 mg | ORAL_TABLET | Freq: Four times a day (QID) | ORAL | Status: DC | PRN

## 2019-07-26 MED ORDER — SODIUM CHLORIDE 0.9 % IV SOLN: INTRAVENOUS | Status: DC | PRN

## 2019-07-26 MED ORDER — PANTOPRAZOLE SODIUM 40 MG PO PACK
40.0000 mg | PACK | ORAL | Status: DC
  Administered 2019-07-26 – 2019-07-27 (×2): 40 mg
  Filled 2019-07-26 (×2): qty 20

## 2019-07-26 MED ORDER — ORAL CARE MOUTH RINSE
15.0000 mL | OROMUCOSAL | Status: DC
  Administered 2019-07-26 – 2019-07-29 (×32): 15 mL via OROMUCOSAL

## 2019-07-26 MED ORDER — QUETIAPINE FUMARATE 50 MG PO TABS
200.0000 mg | ORAL_TABLET | Freq: Two times a day (BID) | ORAL | Status: DC
  Administered 2019-07-26 – 2019-07-27 (×4): 200 mg
  Filled 2019-07-26 (×4): qty 4

## 2019-07-26 MED ORDER — ATORVASTATIN CALCIUM 10 MG PO TABS
20.0000 mg | ORAL_TABLET | Freq: Every day | ORAL | Status: DC
  Administered 2019-07-26 – 2019-07-27 (×2): 20 mg
  Filled 2019-07-26 (×2): qty 2

## 2019-07-26 NOTE — Consult Note (Signed)
NAME:  Casey LoftyDamon A Lightcap, MRN:  [id number], DOB:  Dec 13, 1975, LOS: 1 ADMISSION DATE:  07/25/2019, CONSULTATION DATE: 03/25/2019 REFERRING MD: [facility], CHIEF COMPLAINT: Pneumonia secondary to Covid  Brief History   Patient is a nonverbal 43 year old autistic male with hypoxemia and hypercapnia and acute kidney injury secondary to Covid pneumonia  History of present illness   Patient is a 43 year old male with a medical history significant for autism, he is nonverbal, history of hypertension hyperlipidemia diabetes morbid obesity sleep apnea anxiety and depression is brought to the emergency room with a previous Covid positive test 07/19/2019 and 07/23/2009 2020 at outlying facility for fever and shortness of breath.  Chest x-ray shows predominantly left lower lobe infiltrate with possible effusion.  Patient was brought to the emergency room due to persistent fever increased cough decreased appetite worsening weakness and loose stools.  Covid test here was positive.  ABG shows pH 7.2 PCO2 50 PO2 of 106.  Chemistry is pertinent for anion gap of 10, bicarb 18, creatinine of 3.1.  LDH is 265 with normal troponin  Past Medical History   . Anxiety with depression   . Autism   . DM (diabetes mellitus) (HCC)   . HLD (hyperlipidemia)   . HTN (hypertension)   . Obesity   . Sleep apnea      Significant Hospital Events   Admission 07/26/2019  Consults:  Nephrology  Procedures:  NA  Significant Diagnostic Tests:  Covid positive  Micro Data:  NA  Antimicrobials:  Will start rocephin and zithromax with tracheal aspirate  Interim history/subjective:  NA  Objective   Blood pressure 127/79, pulse 86, temperature 99.2 F (37.3 C), temperature source Oral, resp. rate 20, height 5\' 11"  (1.803 m), weight 117.9 kg, SpO2 100 %.    Vent Mode: PRVC FiO2 (%):  [100 %] 100 % Set Rate:  [20 bmp] 20 bmp Vt Set:  [600 mL] 600 mL PEEP:  [10 cmH20] 10 cmH20 Plateau Pressure:  [24 cmH20] 24 cmH20    Intake/Output Summary (Last 24 hours) at 07/26/2019 0141 Last data filed at 07/25/2019 2201 Gross per 24 hour  Intake 2000 ml  Output -  Net 2000 ml   Filed Weights   07/25/19 1936  Weight: 117.9 kg    Examination: General: Obese male, ventilated HENT: wnl Lungs: decreased lllobe BS Cardiovascular: reg rate rhythm  Abdomen:Benign Extremities: wnl Neuro: sedated GU: nl  Resolved Hospital Problem list   NA  Assessment & Plan:  1.  Covid positive with left lower lobe pneumoni: I am concerned that his pneumonia may be a secondary bacterial pneumonia with chest x-ray atypical for Covid pneumonia.  Will need to be treated with steroids remdesivir will be held due to acute kidney injury.  He may be a candidate for Actemra.  We will start Rocephin and Zithromax for possible community-acquired pneumonia.  [facility].  2.  Acute kidney injury: Continues to make urine output we do not have a baseline creatinine to know whether this is significantly higher than his routine creatinine.  3.  Respiratory failure requiring ventilatory management.  His course will be significantly complicated by his obesity.  4.  Type 2 diabetes mellitus: We will manage acutely with sliding scale insulin  5.  Hypertension we will monitor    Best practice:  Diet: N.p.o. Pain/Anxiety/Delirium protocol (if indicated): Fentanyl as needed VAP protocol (if indicated): Yes DVT prophylaxis: Yes GI prophylaxis: Pepcid Glucose control: Sliding scale Mobility: Bedrest Code  Status: Full Family Communication: Case discussed with Ms. Rose Disposition: To [facility] ICU for further therapy  Labs   CBC: Recent Labs  Lab 07/25/19 1504 07/26/19 0023  WBC 5.3  --   NEUTROABS 4.2  --   HGB 13.6 8.8*  HCT 42.8 26.0*  MCV 94.9  --   PLT 194  --     Basic Metabolic Panel: Recent Labs  Lab 07/25/19 1504 07/26/19 0023  NA 130* 135  K 4.0 4.2  CL 102  --   CO2  18*  --   GLUCOSE 199*  --   BUN 45*  --   CREATININE 3.16*  --   CALCIUM 8.6*  --    GFR: Estimated Creatinine Clearance: 39.8 mL/min (A) (by C-G formula based on SCr of 3.16 mg/dL (H)). Recent Labs  Lab 07/25/19 1504 07/25/19 1600 07/25/19 2005  PROCALCITON 0.29  --   --   WBC 5.3  --   --   LATICACIDVEN  --  1.4 0.8    Liver Function Tests: Recent Labs  Lab 07/25/19 1504  AST 50*  ALT 48*  ALKPHOS 77  BILITOT 0.7  PROT 7.6  ALBUMIN 3.2*   No results for input(s): LIPASE, AMYLASE in the last 168 hours. No results for input(s): AMMONIA in the last 168 hours.  ABG    Component Value Date/Time   PHART 7.211 (L) 07/26/2019 0023   PCO2ART 37.5 07/26/2019 0023   PO2ART 106.0 07/26/2019 0023   HCO3 15.0 (L) 07/26/2019 0023   TCO2 16 (L) 07/26/2019 0023   ACIDBASEDEF 12.0 (H) 07/26/2019 0023   O2SAT 97.0 07/26/2019 0023     Coagulation Profile: Recent Labs  Lab 07/25/19 1656  INR 1.1    Cardiac Enzymes: No results for input(s): CKTOTAL, CKMB, CKMBINDEX, TROPONINI in the last 168 hours.  HbA1C: Hgb A1c MFr Bld  Date/Time Value Ref Range Status  07/25/2019 06:14 PM 7.5 (H) 4.8 - 5.6 % Final    Comment:    (NOTE) Pre diabetes:          5.7%-6.4% Diabetes:              >6.4% Glycemic control for   <7.0% adults with diabetes     CBG: Recent Labs  Lab 07/26/19 0032  GLUCAP 222*    Review of Systems:   Unable to obtain patient is intubated sedated nonverbal  Past Medical History  He,  has a past medical history of Anxiety with depression, Autism, DM (diabetes mellitus) (HCC), HLD (hyperlipidemia), HTN (hypertension), Obesity, and Sleep apnea.   Surgical History    Past Surgical History:  Procedure Laterality Date  . Reviewed and found to be negative       Social History   reports that he has never smoked. He has never used smokeless tobacco. He reports that he does not drink alcohol or use drugs.   Family History   His Family history is  unknown by patient.   Allergies Not on File   Home Medications  Prior to Admission medications   Medication Sig Start Date End Date Taking? Authorizing Provider  acetaminophen (TYLENOL) 325 MG tablet Take 650 mg by mouth every 6 (six) hours as needed for mild pain or fever.   Yes [provider]  atorvastatin (LIPITOR) 20 MG tablet Take 20 mg by mouth at bedtime. 07/11/19  Yes [provider]  clonazePAM (KLONOPIN) 1 MG tablet Take 1 mg by mouth 3 (three) times daily. 07/11/19  Yes [provider]  hydrOXYzine (ATARAX/VISTARIL) 10 MG tablet Take 10 mg by mouth 2 (two) times daily. 06/13/19  Yes [provider]  lithium carbonate (LITHOBID) 300 MG CR tablet Take 300-600 mg by mouth See admin instructions. Take 300mg  in the morning and 600mg  at bedtime. 07/04/19  Yes [provider]  losartan (COZAAR) 25 MG tablet Take 25 mg by mouth daily. 07/05/19  Yes [provider]  metFORMIN (GLUCOPHAGE) 500 MG tablet Take 500 mg by mouth 2 (two) times daily. 07/18/19  Yes [provider]  metoprolol (TOPROL-XL) 200 MG 24 hr tablet Take 200 mg by mouth daily. 07/18/19  Yes [provider]  polyethylene glycol powder (GLYCOLAX/MIRALAX) 17 GM/SCOOP powder Take 17 g by mouth daily. 07/18/19  Yes [provider]  QUEtiapine (SEROQUEL XR) 300 MG 24 hr tablet Take 300 mg by mouth 2 (two) times daily. 06/27/19  Yes [provider]  topiramate (TOPAMAX) 100 MG tablet Take 100 mg by mouth 2 (two) times daily. 07/04/19  Yes [provider]     Critical care time: 40 minutes of time was spent in critical care evaluation planning chart review and bedside evaluation.  This does not include procedural time.

## 2019-07-26 NOTE — Progress Notes (Signed)
Lavaged pt with 10 cc saline flush per CCM MD order. Pt stable throughout with no complications. VS within normal limits. RT will continue to monitor

## 2019-07-26 NOTE — Procedures (Addendum)
Central Venous Catheter Insertion Procedure Note ARSH FEUTZ [id number] [birth year]  Procedure: Insertion of Central Venous Catheter Indications: Assessment of intravascular volume, Drug and/or fluid administration and Frequent blood sampling  Procedure Details Consent: Risks of procedure as well as the alternatives and risks of each were explained to the (patient/caregiver).  Consent for procedure obtained. Time Out: Verified patient identification, verified procedure, site/side was marked, verified correct patient position, special equipment/implants available, medications/allergies/relevent history reviewed, required imaging and test results available.  Performed  Maximum sterile technique was used including antiseptics, cap, gloves, gown, hand hygiene, mask and sheet. Skin prep: Chlorhexidine; local anesthetic administered A antimicrobial bonded/coated triple lumen catheter was placed in the right internal jugular vein using the Seldinger technique.  Evaluation Blood flow good Complications: No apparent complications Patient did tolerate procedure well. Chest X-ray ordered to verify placement.  CXR: pending.  Observed insertion at bedside Laurelyn Sickle MD  Hayden Pedro, AGACNP-BC [facility]  PCCM Pgr: [phone]

## 2019-07-26 NOTE — ED Notes (Signed)
Provider called back. States critical care to come and see pt. Advised Md that they have not seen patient yet, states will contact critical care again.

## 2019-07-26 NOTE — Progress Notes (Signed)
[facility] Progress Note Patient Name: FAUST THORINGTON DOB: 01/13/1976 MRN: [id number]   Date of Service  07/26/2019  HPI/Events of Note  Blood sugar 333 mg % despite resistant scale SQ insulin coverage.  eICU Interventions  Phase 2 hyperglycemia insulin coverage orders entered.        [name] Ogan 07/26/2019, 9:31 PM

## 2019-07-26 NOTE — Progress Notes (Signed)
Phosphorus 1.1.  Will replace with sodium phos.  Chesley Mires, MD [facility] 07/26/2019, 5:50 PM

## 2019-07-26 NOTE — Progress Notes (Signed)
Initial Nutrition Assessment  DOCUMENTATION CODES:   Obesity unspecified  INTERVENTION:   Continue Vital High Protein @ 40 ml/hr (960 ml/day)  Increase Prostat  60 ml Prostat TID  Provides: 1560 kcal, 174 grams protein, and 802 ml free water.  TF regimen and propofol at current rate providing 1824 total kcal/day    NUTRITION DIAGNOSIS:   Increased nutrient needs related to (acute illness - COVID-19) as evidenced by estimated needs.  GOAL:   Patient will meet greater than or equal to 90% of their needs  MONITOR:   TF tolerance, Vent status  REASON FOR ASSESSMENT:   Consult, Ventilator Enteral/tube feeding initiation and management  ASSESSMENT:   Pt with PMH of autism, non-verbal, DM, HTN, HLD, OSA, anxiety/depression who lives at [facility] admitted with COVID-19 PNA and AKI.    Pt required intubation on admission.  Patient is currently intubated on ventilator support MV: 13.7 L/min Temp (24hrs), Avg:99.3 F (37.4 C), Min:97.9 F (36.6 C), Max:102.6 F (39.2 C)  Propofol: 10 ml/hr provides: 264 kcal  Medications reviewed and include: levemir, solumedrol, vitamin C, zinc, remdesivir   Labs reviewed:  CBG: 384   TF: Vital High Protein @ 40 ml/hr with 30 ml Prostat BID provides: 1160 kcal, 114 grams protein  NUTRITION - FOCUSED PHYSICAL EXAM:  Deferred  Diet Order:   Diet Order            Diet NPO time specified  Diet effective now              EDUCATION NEEDS:   No education needs have been identified at this time  Skin:  Skin Assessment: Reviewed RN Assessment  Last BM:     Height:   Ht Readings from Last 1 Encounters:  07/26/19 5\' 11"  (1.803 m)    Weight:   Wt Readings from Last 1 Encounters:  07/26/19 121 kg    Ideal Body Weight:  78.1 kg  BMI:  Body mass index is 37.21 kg/m.  Estimated Nutritional Needs:   Kcal:  1700-1900  Protein:  >156 grams  Fluid:  2L/day  Maylon Peppers RD, [place], [place]  Pager [phone] After Hours Pager

## 2019-07-26 NOTE — ED Notes (Signed)
Called [facility] for transport to [place] stated it should not be very long. They will call before and let us know when on the way to get the pt ready

## 2019-07-26 NOTE — ED Notes (Signed)
Still have not received new orders from critical care. Admitting provider paged again.

## 2019-07-26 NOTE — Progress Notes (Signed)
Spoke with pt's caregiver, Kalman Shan, over the phone.  Updated about current status and treatment plan.  Rose informed me that he has a cousin, Leandra Kern, who would assist in making medical decision [phone]).  Chesley Mires, MD [facility] Pulmonary/Critical Care 07/26/2019, 1:59 PM

## 2019-07-26 NOTE — ED Notes (Signed)
Patient's BPs continue to trend down and Md paged again. Have been in contact with both MD and [facility] regarding pt transfer to [place].

## 2019-07-26 NOTE — Progress Notes (Addendum)
NAME:  Casey Fernandez, MRN:  [id number], DOB:  Oct 06, 1975, LOS: 1 ADMISSION DATE:  07/25/2019, CONSULTATION DATE: 03/25/2019 REFERRING MD: TRH, CHIEF COMPLAINT: Pneumonia secondary to Covid  Brief History   43 yo male from [facility] group home tested positive for COVID 19 on 07/19/19.  He has hx of autism and is non verbal at baseline.  Developed fever, chills, diarrhea, and cough.  Brought to ER and noted to have b/l pulmonary infiltrates, hypoxia, and acute renal failure.  Respiratory status deteriorated in ER and he required intubation.  Subsequently transferred to [facility] for admission.  Past Medical History  Anxiety/depression, Autism, Non verbal, DM, HTN, HLD, OSA  Significant Hospital Events   11/12 Admit 11/13 Transfer to [place]:    Procedures:  ETT 11/12 >> Rt IJ CVL 11/13 >>  Significant Diagnostic Tests:    COVID Therapy:  Solumedrol 11/12 >> Remdesivir 11/13 >>  Micro Data:  SARS CoV2 11/06 >> Positive SARS CoV2 11/12 >> Positive Blood 11/12 >>   Antimicrobials:  Rocephin 11/12 >> Zithromax 11/12 >>   Interim history/subjective:  Remains on sedation, vent.  Objective   Blood pressure (!) 108/58, pulse 87, temperature 97.9 F (36.6 C), temperature source Axillary, resp. rate 20, height 5\' 11"  (1.803 m), weight 121 kg, SpO2 99 %.    Vent Mode: PRVC FiO2 (%):  [40 %-100 %] 40 % Set Rate:  [20 bmp-24 bmp] 24 bmp Vt Set:  [600 mL] 600 mL PEEP:  [10 cmH20] 10 cmH20 Plateau Pressure:  [22 cmH20-24 cmH20] 22 cmH20   Intake/Output Summary (Last 24 hours) at 07/26/2019 0913 Last data filed at 07/26/2019 0800 Gross per 24 hour  Intake 3579.66 ml  Output 725 ml  Net 2854.66 ml   Filed Weights   07/25/19 1936 07/26/19 0700  Weight: 117.9 kg 121 kg    Examination:  General - sedated Eyes - pupils reactive ENT - ETT in place Cardiac - regular rate/rhythm, no murmur Chest - b/l rhonchi Abdomen - soft, non tender, + bowel sounds Extremities -  1+ edema Skin - no rashes Neuro - RASS -2  CXR (reviewed by me) - b/l ASD  Resolved Hospital Problem list   Hypotension from hypovolemia  Assessment & Plan:   Acute hypoxic respiratory failure from COVID 19 pneumonia. Hx of OSA. - goal SpO2 88 to 95% - f/u CXR - day 2 of solumedrol - day 1 of remdesivir - consider prone position if PaO2:FiO2 < 150 - continue zinc, vit C - day 2 of rocephin/zithromax  Acute kidney injury likely from hypovolemia, hypoxia. Non gap metabolic acidosis. - uncertain about what baseline renal fx is - continue IV fluids - f/u BMET and ABG, monitor urine outpt  DM type II. - SSI with lantus - hold outpt metformin  Acute metabolic encephalopathy. Hx of autism, non verbal, anxiety, depression. - RASS goal -1 to -2 - continue outpt klonopin, seroquel, topamax - hold outpt lithium for now  Hx of HTN, HLD. - continue ASA, lipitor - hold outpt cozaar, toprol xl for now  Anemia of critical illness. - f/u CBC - transfuse for Hb < 7 or significant bleeding   Best practice:  Diet: tube feeds DVT prophylaxis: lovenox GI prophylaxis: protonix Mobility: Bedrest Code Status: Full Disposition: ICU  Labs    CMP Latest Ref Rng & Units 07/26/2019 07/26/2019 07/25/2019  Glucose 70 - 99 mg/dL - - 199(H)  BUN 6 - 20 mg/dL - - 45(H)  Creatinine 0.61 -  1.24 mg/dL - - 7.34(L)  Sodium 937 - 145 mmol/L 139 135 130(L)  Potassium 3.5 - 5.1 mmol/L 5.0 4.2 4.0  Chloride 98 - 111 mmol/L - - 102  CO2 22 - 32 mmol/L - - 18(L)  Calcium 8.9 - 10.3 mg/dL - - 8.6(L)  Total Protein 6.5 - 8.1 g/dL - - 7.6  Total Bilirubin 0.3 - 1.2 mg/dL - - 0.7  Alkaline Phos 38 - 126 U/L - - 77  AST 15 - 41 U/L - - 50(H)  ALT 0 - 44 U/L - - 48(H)    CBC Latest Ref Rng & Units 07/26/2019 07/26/2019 07/25/2019  WBC 4.0 - 10.5 K/uL - - 5.3  Hemoglobin 13.0 - 17.0 g/dL 9.0(W) 4.0(X) 73.5  Hematocrit 39.0 - 52.0 % 28.0(L) 26.0(L) 42.8  Platelets 150 - 400 K/uL - - 194     ABG    Component Value Date/Time   PHART 7.230 (L) 07/26/2019 0800   PCO2ART 36.7 07/26/2019 0800   PO2ART 280.0 (H) 07/26/2019 0800   HCO3 15.4 (L) 07/26/2019 0800   TCO2 17 (L) 07/26/2019 0800   ACIDBASEDEF 11.0 (H) 07/26/2019 0800   O2SAT 100.0 07/26/2019 0800    CBG (last 3)  Recent Labs    07/26/19 0032 07/26/19 0809  GLUCAP 222* 332*   CC time 42 minutes  Coralyn Helling, MD  Pulmonary/Critical Care 07/26/2019, 9:30 AM

## 2019-07-26 NOTE — Progress Notes (Signed)
Paged by bedside RN regarding patient was transferred to [facility].  Apparently earlier in the evening patient had been in respiratory distress requiring him to be intubated.  Admitting service was unaware of intubation so page was received requesting ICU bed.  Contacted PCCM regarding the patient being intubated and change in status.  PCCM stated they would see the patient in the ED and transferred to the appropriate floor or [facility].  Arby Barrette, APRN-C [facility] Pager [phone]   Addendum: Was repaged by bedside RN regarding patient's BP dropping with a systolic in the 62E.  Order placed for peripheral Neo-Synephrine and will contact PCCM again.

## 2019-07-27 ENCOUNTER — Inpatient Hospital Stay (HOSPITAL_COMMUNITY): Payer: Medicare Other

## 2019-07-27 DIAGNOSIS — J1289 Other viral pneumonia: Secondary | ICD-10-CM

## 2019-07-27 DIAGNOSIS — J9601 Acute respiratory failure with hypoxia: Secondary | ICD-10-CM

## 2019-07-27 DIAGNOSIS — U071 COVID-19: Principal | ICD-10-CM

## 2019-07-27 LAB — POCT I-STAT 7, (LYTES, BLD GAS, ICA,H+H)
Acid-base deficit: 11 mmol/L — ABNORMAL HIGH (ref 0.0–2.0)
Bicarbonate: 14.7 mmol/L — ABNORMAL LOW (ref 20.0–28.0)
Calcium, Ion: 1.32 mmol/L (ref 1.15–1.40)
HCT: 28 % — ABNORMAL LOW (ref 39.0–52.0)
Hemoglobin: 9.5 g/dL — ABNORMAL LOW (ref 13.0–17.0)
O2 Saturation: 97 %
Patient temperature: 99.8
Potassium: 3.9 mmol/L (ref 3.5–5.1)
Sodium: 145 mmol/L (ref 135–145)
TCO2: 16 mmol/L — ABNORMAL LOW (ref 22–32)
pCO2 arterial: 33 mmHg (ref 32.0–48.0)
pH, Arterial: 7.261 — ABNORMAL LOW (ref 7.350–7.450)
pO2, Arterial: 111 mmHg — ABNORMAL HIGH (ref 83.0–108.0)

## 2019-07-27 LAB — COMPREHENSIVE METABOLIC PANEL
ALT: 52 U/L — ABNORMAL HIGH (ref 0–44)
AST: 54 U/L — ABNORMAL HIGH (ref 15–41)
Albumin: 2.4 g/dL — ABNORMAL LOW (ref 3.5–5.0)
Alkaline Phosphatase: 71 U/L (ref 38–126)
Anion gap: 7 (ref 5–15)
BUN: 46 mg/dL — ABNORMAL HIGH (ref 6–20)
CO2: 16 mmol/L — ABNORMAL LOW (ref 22–32)
Calcium: 8.6 mg/dL — ABNORMAL LOW (ref 8.9–10.3)
Chloride: 123 mmol/L — ABNORMAL HIGH (ref 98–111)
Creatinine, Ser: 2.27 mg/dL — ABNORMAL HIGH (ref 0.61–1.24)
GFR calc Af Amer: 40 mL/min — ABNORMAL LOW (ref >60)
GFR calc non Af Amer: 34 mL/min — ABNORMAL LOW (ref >60)
Glucose, Bld: 300 mg/dL — ABNORMAL HIGH (ref 70–99)
Potassium: 4.1 mmol/L (ref 3.5–5.1)
Sodium: 146 mmol/L — ABNORMAL HIGH (ref 135–145)
Total Bilirubin: <0.1 mg/dL — ABNORMAL LOW (ref 0.3–1.2)
Total Protein: 6.5 g/dL (ref 6.5–8.1)

## 2019-07-27 LAB — CBC WITH DIFFERENTIAL/PLATELET
Abs Immature Granulocytes: 0.17 10*3/uL — ABNORMAL HIGH (ref 0.00–0.07)
Basophils Absolute: 0 10*3/uL (ref 0.0–0.1)
Basophils Relative: 0 %
Eosinophils Absolute: 0 10*3/uL (ref 0.0–0.5)
Eosinophils Relative: 0 %
HCT: 29.8 % — ABNORMAL LOW (ref 39.0–52.0)
Hemoglobin: 9.7 g/dL — ABNORMAL LOW (ref 13.0–17.0)
Immature Granulocytes: 2 %
Lymphocytes Relative: 5 %
Lymphs Abs: 0.5 10*3/uL — ABNORMAL LOW (ref 0.7–4.0)
MCH: 30.5 pg (ref 26.0–34.0)
MCHC: 32.6 g/dL (ref 30.0–36.0)
MCV: 93.7 fL (ref 80.0–100.0)
Monocytes Absolute: 0.6 10*3/uL (ref 0.1–1.0)
Monocytes Relative: 6 %
Neutro Abs: 8.9 10*3/uL — ABNORMAL HIGH (ref 1.7–7.7)
Neutrophils Relative %: 87 %
Platelets: 291 10*3/uL (ref 150–400)
RBC: 3.18 MIL/uL — ABNORMAL LOW (ref 4.22–5.81)
RDW: 13.3 % (ref 11.5–15.5)
WBC: 10.2 10*3/uL (ref 4.0–10.5)
nRBC: 0 % (ref 0.0–0.2)

## 2019-07-27 LAB — BASIC METABOLIC PANEL
Anion gap: 7 (ref 5–15)
BUN: 49 mg/dL — ABNORMAL HIGH (ref 6–20)
CO2: 17 mmol/L — ABNORMAL LOW (ref 22–32)
Calcium: 8.5 mg/dL — ABNORMAL LOW (ref 8.9–10.3)
Chloride: 123 mmol/L — ABNORMAL HIGH (ref 98–111)
Creatinine, Ser: 2.42 mg/dL — ABNORMAL HIGH (ref 0.61–1.24)
GFR calc Af Amer: 37 mL/min — ABNORMAL LOW (ref >60)
GFR calc non Af Amer: 32 mL/min — ABNORMAL LOW (ref >60)
Glucose, Bld: 299 mg/dL — ABNORMAL HIGH (ref 70–99)
Potassium: 4.2 mmol/L (ref 3.5–5.1)
Sodium: 147 mmol/L — ABNORMAL HIGH (ref 135–145)

## 2019-07-27 LAB — GLUCOSE, CAPILLARY
Glucose-Capillary: 389 mg/dL — ABNORMAL HIGH (ref 70–99)
Glucose-Capillary: 332 mg/dL — ABNORMAL HIGH (ref 70–99)
Glucose-Capillary: 278 mg/dL — ABNORMAL HIGH (ref 70–99)
Glucose-Capillary: 212 mg/dL — ABNORMAL HIGH (ref 70–99)
Glucose-Capillary: 187 mg/dL — ABNORMAL HIGH (ref 70–99)
Glucose-Capillary: 209 mg/dL — ABNORMAL HIGH (ref 70–99)
Glucose-Capillary: 219 mg/dL — ABNORMAL HIGH (ref 70–99)
Glucose-Capillary: 243 mg/dL — ABNORMAL HIGH (ref 70–99)
Glucose-Capillary: 256 mg/dL — ABNORMAL HIGH (ref 70–99)

## 2019-07-27 LAB — FERRITIN: Ferritin: 397 ng/mL — ABNORMAL HIGH (ref 24–336)

## 2019-07-27 LAB — PHOSPHORUS
Phosphorus: 1.8 mg/dL — ABNORMAL LOW (ref 2.5–4.6)
Phosphorus: 2.6 mg/dL (ref 2.5–4.6)

## 2019-07-27 LAB — MAGNESIUM
Magnesium: 2.3 mg/dL (ref 1.7–2.4)
Magnesium: 2.4 mg/dL (ref 1.7–2.4)

## 2019-07-27 LAB — URINE CULTURE: Culture: NO GROWTH

## 2019-07-27 LAB — C-REACTIVE PROTEIN: CRP: 12.3 mg/dL — ABNORMAL HIGH (ref <1.0)

## 2019-07-27 MED ORDER — DEXAMETHASONE SODIUM PHOSPHATE 4 MG/ML IJ SOLN
4.0000 mg | INTRAMUSCULAR | Status: DC
  Administered 2019-07-27 – 2019-07-31 (×5): 4 mg via INTRAVENOUS
  Filled 2019-07-27 (×5): qty 1

## 2019-07-27 MED ORDER — DOCUSATE SODIUM 50 MG/5ML PO LIQD: 100.0000 mg | Freq: Two times a day (BID) | ORAL | Status: DC | PRN

## 2019-07-27 MED ORDER — INSULIN ASPART 100 UNIT/ML ~~LOC~~ SOLN
7.0000 [IU] | SUBCUTANEOUS | Status: DC
  Administered 2019-07-27 (×4): 7 [IU] via SUBCUTANEOUS

## 2019-07-27 MED ORDER — GUAIFENESIN 100 MG/5ML PO SOLN
5.0000 mL | Freq: Four times a day (QID) | ORAL | Status: DC | PRN
  Administered 2019-07-27: 100 mg via ORAL
  Filled 2019-07-27: qty 15

## 2019-07-27 MED ORDER — INSULIN DETEMIR 100 UNIT/ML ~~LOC~~ SOLN
20.0000 [IU] | Freq: Two times a day (BID) | SUBCUTANEOUS | Status: DC
  Administered 2019-07-27 (×2): 20 [IU] via SUBCUTANEOUS
  Filled 2019-07-27 (×4): qty 0.2

## 2019-07-27 MED ORDER — DEXTROSE 10 % IV SOLN: INTRAVENOUS | Status: DC | PRN

## 2019-07-27 MED ORDER — PROPOFOL 1000 MG/100ML IV EMUL
0.0000 ug/kg/min | INTRAVENOUS | Status: DC
  Administered 2019-07-27 (×2): 35 ug/kg/min via INTRAVENOUS
  Administered 2019-07-27: 33.935 ug/kg/min via INTRAVENOUS
  Administered 2019-07-27: 20 ug/kg/min via INTRAVENOUS
  Administered 2019-07-28: 35 ug/kg/min via INTRAVENOUS
  Administered 2019-07-28: 25 ug/kg/min via INTRAVENOUS
  Filled 2019-07-27 (×5): qty 100

## 2019-07-27 MED ORDER — ASPIRIN 81 MG PO CHEW
81.0000 mg | CHEWABLE_TABLET | Freq: Every day | ORAL | Status: DC
  Administered 2019-07-27: 81 mg

## 2019-07-27 MED ORDER — BISACODYL 10 MG RE SUPP
10.0000 mg | Freq: Every day | RECTAL | Status: DC | PRN
  Filled 2019-07-27: qty 1

## 2019-07-27 MED ORDER — FENTANYL CITRATE (PF) 100 MCG/2ML IJ SOLN
50.0000 ug | INTRAMUSCULAR | Status: DC | PRN
  Administered 2019-07-27 (×2): 100 ug via INTRAVENOUS
  Filled 2019-07-27: qty 2

## 2019-07-27 MED ORDER — SODIUM CHLORIDE 0.9 % IV SOLN
INTRAVENOUS | Status: DC | PRN
  Administered 2019-07-27: 1000 mL via INTRAVENOUS

## 2019-07-27 MED ORDER — MIDAZOLAM HCL 2 MG/2ML IJ SOLN: 2.0000 mg | INTRAMUSCULAR | Status: DC | PRN

## 2019-07-27 MED ORDER — STERILE WATER FOR INJECTION IV SOLN
INTRAVENOUS | Status: DC
  Administered 2019-07-27 – 2019-07-28 (×2): via INTRAVENOUS
  Filled 2019-07-27 (×3): qty 9.71

## 2019-07-27 MED ORDER — SODIUM PHOSPHATES 45 MMOLE/15ML IV SOLN
10.0000 mmol | Freq: Once | INTRAVENOUS | Status: AC
  Administered 2019-07-27: 10 mmol via INTRAVENOUS
  Filled 2019-07-27: qty 3.33

## 2019-07-27 MED ORDER — INSULIN ASPART 100 UNIT/ML ~~LOC~~ SOLN
3.0000 [IU] | SUBCUTANEOUS | Status: DC
  Administered 2019-07-27 (×4): 9 [IU] via SUBCUTANEOUS

## 2019-07-27 NOTE — Progress Notes (Signed)
ETT flushed with 10 cc saline per order.  ETS small amt clear/white secretions.  Patient tolerated well, no complication.

## 2019-07-27 NOTE — Progress Notes (Signed)
Lavaged pt with 10 cc flush per MD order. Small amount of white thick secretions suctioned out. Pt coughed aggressively throughout but settled down after completed. VS within normal limits. RT will continue to monitor

## 2019-07-27 NOTE — Progress Notes (Signed)
[place] Progress Note Patient Name: Casey Fernandez DOB: 15-Sep-1975 MRN: [id number]   Date of Service  07/27/2019  HPI/Events of Note  RN noted some perioral twitching. Patient was able to withdraw to painful stimuli at the time. No limb jerking noted, no vent asynchrony and no change in vitals at the time. Patient currently on propofol and IV bicarb. Has PRN fentanyl and versed if needed. When I saw him on camera, no twitching was seen, patient moving to painful stimuli. RN also noted he has bouts of cough and gets uncomfortable on the vent during those bouts.   eICU Interventions  1. Check BMP and ionized calcium. RN will call with results 2. Robitussin ordered     Intervention Category Major Interventions: Other: Minor Interventions: Electrolytes abnormality - evaluation and management  Lynea Rollison G Rashaan Wyles 07/27/2019, 8:21 PM

## 2019-07-27 NOTE — Progress Notes (Signed)
NAME:  Casey Fernandez, MRN:  [id number], DOB:  [birth year], LOS: 2 ADMISSION DATE:  07/25/2019, CONSULTATION DATE: 03/25/2019 REFERRING MD: TRH, CHIEF COMPLAINT: Pneumonia secondary to Covid  Brief History   43 yo male from [facility] group [facility] tested positive for COVID 19 on 07/19/19.  He has hx of autism and is non verbal at baseline.  Developed fever, chills, diarrhea, and cough.  Brought to ER and noted to have b/l pulmonary infiltrates, hypoxia, and acute renal failure.  Respiratory status deteriorated in ER and he required intubation.  Subsequently transferred to [facility] for admission.  Past Medical History  Anxiety/depression, Autism, Non verbal, DM, HTN, HLD, OSA  Significant Hospital Events   11/12 Admit 11/13 Transfer to [facility]  Consults:    Procedures:  ETT 11/12 >> Rt IJ CVL 11/13 >>  Significant Diagnostic Tests:    COVID Therapy:  Steroids 11/12 >> Remdesivir 11/13 >>  Micro Data:  SARS CoV2 11/06 >> Positive SARS CoV2 11/12 >> Positive Blood 11/12 >>   Antimicrobials:  Rocephin 11/12 >> Zithromax 11/12 >>   Interim history/subjective:  Started on insulin gtt overnight.  Objective   Blood pressure (!) 95/54, pulse 79, temperature 99.8 F (37.7 C), temperature source Oral, resp. rate (!) 24, height 5\' 11"  (1.803 m), weight 121.6 kg, SpO2 97 %.    Vent Mode: PRVC FiO2 (%):  [30 %-40 %] 30 % Set Rate:  [24 bmp] 24 bmp Vt Set:  [600 mL] 600 mL PEEP:  [5 cmH20-8 cmH20] 5 cmH20 Plateau Pressure:  [18 cmH20-23 cmH20] 18 cmH20   Intake/Output Summary (Last 24 hours) at 07/27/2019 0841 Last data filed at 07/27/2019 0600 Gross per 24 hour  Intake 3619.4 ml  Output 4265 ml  Net -645.6 ml   Filed Weights   07/25/19 1936 07/26/19 0700 07/27/19 0500  Weight: 117.9 kg 121 kg 121.6 kg    Examination:  General - sedated Eyes - pupils reactive ENT - ETT in place Cardiac - regular rate/rhythm, no murmur Chest - b/l rhonchi Abdomen - soft, non tender, +  bowel sounds Extremities - no cyanosis, clubbing, or edema Skin - no rashes Neuro - RASS -3  CXR - b/l interstitial ASD (reviewed by me)   Resolved Hospital Problem list   Hypotension from hypovolemia  Assessment & Plan:   Acute hypoxic respiratory failure from COVID 19 pneumonia. Hx of OSA. - goal SpO2 88 to 95% - day 3 of steroids - day 2 of remdesivir - continue zinc, vit C - day 3/5 of rocephin, zithromax  Acute kidney injury likely from hypovolemia, hypoxia. Non gap metabolic acidosis. Hypophosphatemia. - uncertain about what baseline renal fx is - change IV fluids to add HCO3 - f/u BMET, ABG - monitor urine outpt  DM type II. - try to transition off insulin gtt - hold outpt metformin  Acute metabolic encephalopathy. Hx of autism, non verbal, anxiety, depression. - RASS goal 0 to -1 - continue outpt klonopin, seroquel, topamax - lithium level was elevated on admission >> hold outpt lithium for now  Hx of HTN, HLD. - continue ASA, lipitor - hold outpt cozaar, toprol xl for now  Anemia of critical illness. - f/u CBC - transfuse for Hb < 7 or significant bleeding   Best practice:  Diet: tube feeds DVT prophylaxis: lovenox GI prophylaxis: protonix Mobility: Bedrest Code Status: Full Disposition: ICU  Labs    CMP Latest Ref Rng & Units 07/27/2019 07/27/2019 07/26/2019  Glucose 70 - 99 mg/dL  300(H) - -  BUN 6 - 20 mg/dL 46(H) - -  Creatinine 0.61 - 1.24 mg/dL 2.27(H) - -  Sodium 135 - 145 mmol/L 146(H) 145 141  Potassium 3.5 - 5.1 mmol/L 4.1 3.9 4.9  Chloride 98 - 111 mmol/L 123(H) - -  CO2 22 - 32 mmol/L 16(L) - -  Calcium 8.9 - 10.3 mg/dL 8.6(L) - -  Total Protein 6.5 - 8.1 g/dL 6.5 - -  Total Bilirubin 0.3 - 1.2 mg/dL <0.1(L) - -  Alkaline Phos 38 - 126 U/L 71 - -  AST 15 - 41 U/L 54(H) - -  ALT 0 - 44 U/L 52(H) - -    CBC Latest Ref Rng & Units 07/27/2019 07/27/2019 07/26/2019  WBC 4.0 - 10.5 K/uL 10.2 - -  Hemoglobin 13.0 - 17.0 g/dL  9.7(L) 9.5(L) 9.9(L)  Hematocrit 39.0 - 52.0 % 29.8(L) 28.0(L) 29.0(L)  Platelets 150 - 400 K/uL 291 - -    ABG    Component Value Date/Time   PHART 7.261 (L) 07/27/2019 0444   PCO2ART 33.0 07/27/2019 0444   PO2ART 111.0 (H) 07/27/2019 0444   HCO3 14.7 (L) 07/27/2019 0444   TCO2 16 (L) 07/27/2019 0444   ACIDBASEDEF 11.0 (H) 07/27/2019 0444   O2SAT 97.0 07/27/2019 0444    CBG (last 3)  Recent Labs    07/27/19 0346 07/27/19 0647 07/27/19 0753  GLUCAP 278* 212* 187*   CC time 33 minutes  Chesley Mires, MD [facility] Pulmonary/Critical Care 07/27/2019, 8:41 AM

## 2019-07-27 NOTE — Progress Notes (Signed)
[place] Progress Note Patient Name: Casey Fernandez DOB: November 07, 1975 MRN: [id number]   Date of Service  07/27/2019  HPI/Events of Note  RN requests renewal of restraint orders.  eICU Interventions  Restraint orders renewed.        [name] [name] 07/27/2019, 2:15 AM

## 2019-07-27 NOTE — Progress Notes (Signed)
Pt had 3 consecutive CBGs > 300 despite covering with resistant sliding scale. Notified MD who ordered a gtt. Insulin gtt initiated.

## 2019-07-27 NOTE — Progress Notes (Signed)
Spoke with pt's cousin, Leandra Kern.  Update about current status and treatment plan.  Nicole Kindred informed me that Khang is non verbal and has very limited ability to understand what is spoken too him.  Chesley Mires, MD [facility] Pulmonary/Critical Care 07/27/2019, 1:55 PM

## 2019-07-28 ENCOUNTER — Inpatient Hospital Stay (HOSPITAL_COMMUNITY): Payer: Medicare Other

## 2019-07-28 LAB — CBC
HCT: 27.9 % — ABNORMAL LOW (ref 39.0–52.0)
Hemoglobin: 9.1 g/dL — ABNORMAL LOW (ref 13.0–17.0)
MCH: 30.7 pg (ref 26.0–34.0)
MCHC: 32.6 g/dL (ref 30.0–36.0)
MCV: 94.3 fL (ref 80.0–100.0)
Platelets: 327 10*3/uL (ref 150–400)
RBC: 2.96 MIL/uL — ABNORMAL LOW (ref 4.22–5.81)
RDW: 14.1 % (ref 11.5–15.5)
WBC: 9 10*3/uL (ref 4.0–10.5)
nRBC: 0.2 % (ref 0.0–0.2)

## 2019-07-28 LAB — POCT I-STAT 7, (LYTES, BLD GAS, ICA,H+H)
Acid-base deficit: 9 mmol/L — ABNORMAL HIGH (ref 0.0–2.0)
Bicarbonate: 15.3 mmol/L — ABNORMAL LOW (ref 20.0–28.0)
Calcium, Ion: 1.31 mmol/L (ref 1.15–1.40)
HCT: 29 % — ABNORMAL LOW (ref 39.0–52.0)
Hemoglobin: 9.9 g/dL — ABNORMAL LOW (ref 13.0–17.0)
O2 Saturation: 95 %
Patient temperature: 98.6
Potassium: 4.1 mmol/L (ref 3.5–5.1)
Sodium: 149 mmol/L — ABNORMAL HIGH (ref 135–145)
TCO2: 16 mmol/L — ABNORMAL LOW (ref 22–32)
pCO2 arterial: 27.3 mmHg — ABNORMAL LOW (ref 32.0–48.0)
pH, Arterial: 7.356 (ref 7.350–7.450)
pO2, Arterial: 78 mmHg — ABNORMAL LOW (ref 83.0–108.0)

## 2019-07-28 LAB — BASIC METABOLIC PANEL
Anion gap: 6 (ref 5–15)
BUN: 52 mg/dL — ABNORMAL HIGH (ref 6–20)
CO2: 18 mmol/L — ABNORMAL LOW (ref 22–32)
Calcium: 8.6 mg/dL — ABNORMAL LOW (ref 8.9–10.3)
Chloride: 123 mmol/L — ABNORMAL HIGH (ref 98–111)
Creatinine, Ser: 2.32 mg/dL — ABNORMAL HIGH (ref 0.61–1.24)
GFR calc Af Amer: 39 mL/min — ABNORMAL LOW (ref >60)
GFR calc non Af Amer: 33 mL/min — ABNORMAL LOW (ref >60)
Glucose, Bld: 300 mg/dL — ABNORMAL HIGH (ref 70–99)
Potassium: 4.2 mmol/L (ref 3.5–5.1)
Sodium: 147 mmol/L — ABNORMAL HIGH (ref 135–145)

## 2019-07-28 LAB — GLUCOSE, CAPILLARY
Glucose-Capillary: 172 mg/dL — ABNORMAL HIGH (ref 70–99)
Glucose-Capillary: 172 mg/dL — ABNORMAL HIGH (ref 70–99)
Glucose-Capillary: 197 mg/dL — ABNORMAL HIGH (ref 70–99)
Glucose-Capillary: 207 mg/dL — ABNORMAL HIGH (ref 70–99)
Glucose-Capillary: 238 mg/dL — ABNORMAL HIGH (ref 70–99)
Glucose-Capillary: 242 mg/dL — ABNORMAL HIGH (ref 70–99)
Glucose-Capillary: 253 mg/dL — ABNORMAL HIGH (ref 70–99)
Glucose-Capillary: 263 mg/dL — ABNORMAL HIGH (ref 70–99)
Glucose-Capillary: 264 mg/dL — ABNORMAL HIGH (ref 70–99)
Glucose-Capillary: 273 mg/dL — ABNORMAL HIGH (ref 70–99)
Glucose-Capillary: 277 mg/dL — ABNORMAL HIGH (ref 70–99)

## 2019-07-28 LAB — TRIGLYCERIDES: Triglycerides: 236 mg/dL — ABNORMAL HIGH (ref <150)

## 2019-07-28 LAB — D-DIMER, QUANTITATIVE: D-Dimer, Quant: 0.62 ug/mL-FEU — ABNORMAL HIGH (ref 0.00–0.50)

## 2019-07-28 LAB — C-REACTIVE PROTEIN: CRP: 6.2 mg/dL — ABNORMAL HIGH (ref <1.0)

## 2019-07-28 MED ORDER — INSULIN DETEMIR 100 UNIT/ML ~~LOC~~ SOLN
20.0000 [IU] | Freq: Two times a day (BID) | SUBCUTANEOUS | Status: DC
  Administered 2019-07-28: 20 [IU] via SUBCUTANEOUS
  Filled 2019-07-28 (×2): qty 0.2

## 2019-07-28 MED ORDER — ATORVASTATIN CALCIUM 10 MG PO TABS
20.0000 mg | ORAL_TABLET | Freq: Every day | ORAL | Status: DC
  Administered 2019-07-29 – 2019-07-30 (×2): 20 mg via ORAL
  Filled 2019-07-28 (×2): qty 2

## 2019-07-28 MED ORDER — ACETAMINOPHEN 325 MG PO TABS: 650.0000 mg | ORAL_TABLET | Freq: Four times a day (QID) | ORAL | Status: DC | PRN

## 2019-07-28 MED ORDER — ZINC SULFATE 220 (50 ZN) MG PO CAPS
220.0000 mg | ORAL_CAPSULE | Freq: Every day | ORAL | Status: DC
  Administered 2019-07-29 – 2019-07-31 (×3): 220 mg via ORAL
  Filled 2019-07-28 (×4): qty 1

## 2019-07-28 MED ORDER — INSULIN DETEMIR 100 UNIT/ML ~~LOC~~ SOLN
20.0000 [IU] | Freq: Every day | SUBCUTANEOUS | Status: DC
  Administered 2019-07-28 – 2019-07-31 (×4): 20 [IU] via SUBCUTANEOUS
  Filled 2019-07-28 (×4): qty 0.2

## 2019-07-28 MED ORDER — ASPIRIN 81 MG PO CHEW
81.0000 mg | CHEWABLE_TABLET | Freq: Every day | ORAL | Status: DC
  Administered 2019-07-29 – 2019-07-31 (×3): 81 mg via ORAL
  Filled 2019-07-28 (×4): qty 1

## 2019-07-28 MED ORDER — INSULIN ASPART 100 UNIT/ML ~~LOC~~ SOLN
0.0000 [IU] | SUBCUTANEOUS | Status: DC
  Administered 2019-07-28: 7 [IU] via SUBCUTANEOUS
  Administered 2019-07-28: 11 [IU] via SUBCUTANEOUS
  Administered 2019-07-28: 4 [IU] via SUBCUTANEOUS
  Administered 2019-07-29: 15 [IU] via SUBCUTANEOUS
  Administered 2019-07-29: 4 [IU] via SUBCUTANEOUS
  Administered 2019-07-29: 7 [IU] via SUBCUTANEOUS
  Administered 2019-07-29 (×2): 15 [IU] via SUBCUTANEOUS
  Administered 2019-07-30: 4 [IU] via SUBCUTANEOUS
  Administered 2019-07-30: 5 [IU] via SUBCUTANEOUS
  Administered 2019-07-30: 3 [IU] via SUBCUTANEOUS

## 2019-07-28 MED ORDER — VITAMIN C 500 MG PO TABS
500.0000 mg | ORAL_TABLET | Freq: Every day | ORAL | Status: DC
  Administered 2019-07-29 – 2019-07-31 (×3): 500 mg via ORAL
  Filled 2019-07-28 (×4): qty 1

## 2019-07-28 MED ORDER — INSULIN ASPART 100 UNIT/ML ~~LOC~~ SOLN: 0.0000 [IU] | Freq: Every day | SUBCUTANEOUS | Status: DC

## 2019-07-28 MED ORDER — INSULIN ASPART 100 UNIT/ML ~~LOC~~ SOLN: 6.0000 [IU] | Freq: Three times a day (TID) | SUBCUTANEOUS | Status: DC

## 2019-07-28 MED ORDER — CLONAZEPAM 1 MG PO TABS
1.0000 mg | ORAL_TABLET | Freq: Three times a day (TID) | ORAL | Status: DC
  Administered 2019-07-29 – 2019-07-31 (×7): 1 mg via ORAL
  Filled 2019-07-28 (×7): qty 1

## 2019-07-28 MED ORDER — FAMOTIDINE IN NACL 20-0.9 MG/50ML-% IV SOLN
20.0000 mg | Freq: Two times a day (BID) | INTRAVENOUS | Status: DC
  Administered 2019-07-28 – 2019-07-29 (×3): 20 mg via INTRAVENOUS
  Filled 2019-07-28 (×3): qty 50

## 2019-07-28 MED ORDER — LACTATED RINGERS IV SOLN
INTRAVENOUS | Status: DC
  Administered 2019-07-28 – 2019-07-29 (×2): via INTRAVENOUS

## 2019-07-28 MED ORDER — TOPIRAMATE 100 MG PO TABS
100.0000 mg | ORAL_TABLET | Freq: Two times a day (BID) | ORAL | Status: DC
  Administered 2019-07-29 – 2019-07-31 (×5): 100 mg via ORAL
  Filled 2019-07-28 (×10): qty 1

## 2019-07-28 MED ORDER — INSULIN ASPART 100 UNIT/ML ~~LOC~~ SOLN
0.0000 [IU] | Freq: Three times a day (TID) | SUBCUTANEOUS | Status: DC
  Administered 2019-07-28: 4 [IU] via SUBCUTANEOUS

## 2019-07-28 MED ORDER — CHLORHEXIDINE GLUCONATE CLOTH 2 % EX PADS
6.0000 | MEDICATED_PAD | Freq: Every day | CUTANEOUS | Status: DC
  Administered 2019-07-29: 6 via TOPICAL

## 2019-07-28 MED ORDER — QUETIAPINE FUMARATE 25 MG PO TABS
200.0000 mg | ORAL_TABLET | Freq: Two times a day (BID) | ORAL | Status: DC
  Administered 2019-07-29 – 2019-07-31 (×5): 200 mg via ORAL
  Filled 2019-07-28 (×3): qty 8
  Filled 2019-07-28: qty 4
  Filled 2019-07-28: qty 8
  Filled 2019-07-28: qty 4

## 2019-07-28 MED ORDER — INSULIN ASPART 100 UNIT/ML ~~LOC~~ SOLN
18.0000 [IU] | Freq: Once | SUBCUTANEOUS | Status: AC
  Administered 2019-07-28: 18 [IU] via SUBCUTANEOUS

## 2019-07-28 NOTE — Progress Notes (Signed)
NAME:  Casey Fernandez, MRN:  [id number], DOB:  Jun 02, 1976, LOS: 3 ADMISSION DATE:  07/25/2019, CONSULTATION DATE: 03/25/2019 REFERRING MD: TRH, CHIEF COMPLAINT: Pneumonia secondary to Covid  Brief History   43 yo male from [facility] group home tested positive for COVID 19 on 07/19/19.  He has hx of autism and is non verbal at baseline.  Developed fever, chills, diarrhea, and cough.  Brought to ER and noted to have b/l pulmonary infiltrates, hypoxia, and acute renal failure.  Respiratory status deteriorated in ER and he required intubation.  Subsequently transferred to [facility] for admission.  Past Medical History  Anxiety/depression, Autism, DM, HTN, HLD, OSA  Significant Hospital Events   11/12 Admit 11/13 Transfer to [facility] 11/14 add HCO3 gtt 11/15 extubate, d/c HCO3 gtt  Consults:    Procedures:  ETT 11/12 >> Rt IJ CVL 11/13 >>  Significant Diagnostic Tests:    COVID Therapy:  Steroids 11/12 >> Remdesivir 11/13 >>  Micro Data:  SARS CoV2 11/06 >> Positive SARS CoV2 11/12 >> Positive Blood 11/12 >>   Antimicrobials:  Rocephin 11/12 >> Zithromax 11/12 >>   Interim history/subjective:  Remains on insulin gtt.  Air leaking around cuff.  FiO2 at 40%.  Objective   Blood pressure 130/75, pulse 87, temperature 98.8 F (37.1 C), temperature source Oral, resp. rate (!) 34, height 5\' 11"  (1.803 m), weight 120.8 kg, SpO2 98 %.    Vent Mode: PRVC FiO2 (%):  [30 %] 30 % Set Rate:  [24 bmp] 24 bmp Vt Set:  [600 mL] 600 mL PEEP:  [5 cmH20] 5 cmH20 Plateau Pressure:  [19 cmH20-28 cmH20] 19 cmH20   Intake/Output Summary (Last 24 hours) at 07/28/2019 0904 Last data filed at 07/28/2019 0747 Gross per 24 hour  Intake 4157.58 ml  Output 1700 ml  Net 2457.58 ml   Filed Weights   07/26/19 0700 07/27/19 0500 07/28/19 0500  Weight: 121 kg 121.6 kg 120.8 kg    Examination:  General - sedated Eyes - pupils reactive ENT - ETT in place Cardiac - regular rate/rhythm, no murmur  Chest - scattered rhonchi Abdomen - soft, non tender, + bowel sounds Extremities - no cyanosis, clubbing, or edema Skin - no rashes Neuro - moves extremities, able to follows simple commands  CXR - b/l ASD, rotated film, some improvement on RT side (reviewed by me)    Resolved Hospital Problem list   Hypotension from hypovolemia, Hypophosphatemia  Assessment & Plan:   Acute hypoxic respiratory failure from COVID 19 pneumonia. Hx of OSA. - extubation trial 11/15 - goal SpO2 85 to 95% - mobilize as able - day 4 of steroids - day 3 of remdesivir - continue zinc, vit C - day 4/5 of rocephin, zithromax (stopped date in orders)  Acute kidney injury likely from hypovolemia, hypoxia. Non gap metabolic acidosis with hyperchloremia. - acidosis, renal fx improved - d/c HCO3 from IV fluid - change to LR at 50 ml/hr - f/u BMET  DM type II with steroid induced hyperglycemia. - remains on insulin gtt 11/15 - hold outpt metformin  Acute metabolic encephalopathy. Hx of autism, anxiety, depression. - monitor mental status after extubation; has ability to follow simple commands and communicate simple requests - continue outpt klonopin, seroquel, topamax - lithium level was elevated on admission >> hold outpt lithium for now  Hx of HTN, HLD. - continue ASA, lipitor - hold outpt cozaar, toprol xl for now  Anemia of critical illness. - f/u CBC - transfuse for  Hb < 7 or significant bleeding   Best practice:  Diet: advance to CHO modified diet after extubation DVT prophylaxis: lovenox GI prophylaxis: pepcid Mobility: OOB to chair Code Status: Full Disposition: ICU  Labs    CMP Latest Ref Rng & Units 07/28/2019 07/28/2019 07/27/2019  Glucose 70 - 99 mg/dL 300(H) - 299(H)  BUN 6 - 20 mg/dL 52(H) - 49(H)  Creatinine 0.61 - 1.24 mg/dL 2.32(H) - 2.42(H)  Sodium 135 - 145 mmol/L 147(H) 149(H) 147(H)  Potassium 3.5 - 5.1 mmol/L 4.2 4.1 4.2  Chloride 98 - 111 mmol/L 123(H) -  123(H)  CO2 22 - 32 mmol/L 18(L) - 17(L)  Calcium 8.9 - 10.3 mg/dL 8.6(L) - 8.5(L)  Total Protein 6.5 - 8.1 g/dL - - -  Total Bilirubin 0.3 - 1.2 mg/dL - - -  Alkaline Phos 38 - 126 U/L - - -  AST 15 - 41 U/L - - -  ALT 0 - 44 U/L - - -    CBC Latest Ref Rng & Units 07/28/2019 07/28/2019 07/27/2019  WBC 4.0 - 10.5 K/uL 9.0 - 10.2  Hemoglobin 13.0 - 17.0 g/dL 9.1(L) 9.9(L) 9.7(L)  Hematocrit 39.0 - 52.0 % 27.9(L) 29.0(L) 29.8(L)  Platelets 150 - 400 K/uL 327 - 291    ABG    Component Value Date/Time   PHART 7.356 07/28/2019 0443   PCO2ART 27.3 (L) 07/28/2019 0443   PO2ART 78.0 (L) 07/28/2019 0443   HCO3 15.3 (L) 07/28/2019 0443   TCO2 16 (L) 07/28/2019 0443   ACIDBASEDEF 9.0 (H) 07/28/2019 0443   O2SAT 95.0 07/28/2019 0443    CBG (last 3)  Recent Labs    07/28/19 0613 07/28/19 0721 07/28/19 0835  GLUCAP 253* 264* 263*       Component Value Date/Time   CRP 6.2 (H) 07/28/2019 0445   CRP 12.3 (H) 07/27/2019 0445   CRP 14.5 (H) 07/25/2019 1504    CC time 32 minutes  Chesley Mires, MD [place] 07/28/2019, 9:04 AM

## 2019-07-28 NOTE — Progress Notes (Signed)
Caregiver called and RN gave update on pts current status. RN answered question and addressed concerns.

## 2019-07-28 NOTE — Procedures (Signed)
Extubation Procedure Note  Patient Details:   Name: Casey Fernandez DOB: 1975/11/12 MRN: [id number]   Airway Documentation:    Vent end date: 07/28/19 Vent end time: 0842   Evaluation  O2 sats: stable throughout Complications: No apparent complications Patient did tolerate procedure well. Bilateral Breath Sounds: Rhonchi, Diminished   Yes - mumbles  Patient has a positive cuff leak.  Patient extubated and placed on [place] 4 L with humidity.  Due to his mental disability, he did not follow commands to cough but did clear his airway.  RT and RN suctioned orally.  Patient tolerated well.  RT will continue to monitor.  Bayard Beaver 07/28/2019, 9:51 AM

## 2019-07-28 NOTE — Progress Notes (Signed)
[place] Progress Note Patient Name: Casey Fernandez DOB: [birth year] MRN: [id number]   Date of Service  07/28/2019  HPI/Events of Note  Hyperglycemia - was given his long acting insulin as well as 16 units aspart on last check. Glucose now 277  eICU Interventions  Try one time larger dose of aspart If next level is not controlled, will need to restart insulin infusion Discussed wth RN      Intervention Category Major Interventions: Hyperglycemia - active titration of insulin therapy  Heavenlee Maiorana G Yailin Biederman 07/28/2019, 1:28 AM

## 2019-07-28 NOTE — Progress Notes (Signed)
Unable to suction at this time as pt is biting ett.

## 2019-07-28 NOTE — Progress Notes (Signed)
[place] Progress Note Patient Name: Casey Fernandez DOB: [birth year] MRN: [id number]   Date of Service  07/28/2019  HPI/Events of Note  Restraint renewal requested   eICU Interventions  Intubated patient.  Renewed order     Intervention Category Minor Interventions: Other:  Margaretmary Lombard 07/28/2019, 5:21 AM

## 2019-07-28 NOTE — Progress Notes (Signed)
Spoke with pt's relative, Nicole Kindred.  Updated about events of this morning with successful extubation, improved O2 needs, and need to f/u with speech therapy for swallow assessment.  Chesley Mires, MD [facility] Pulmonary/Critical Care 07/28/2019, 3:18 PM

## 2019-07-29 DIAGNOSIS — E119 Type 2 diabetes mellitus without complications: Secondary | ICD-10-CM

## 2019-07-29 DIAGNOSIS — F840 Autistic disorder: Secondary | ICD-10-CM

## 2019-07-29 DIAGNOSIS — G4733 Obstructive sleep apnea (adult) (pediatric): Secondary | ICD-10-CM

## 2019-07-29 DIAGNOSIS — Z794 Long term (current) use of insulin: Secondary | ICD-10-CM

## 2019-07-29 DIAGNOSIS — N179 Acute kidney failure, unspecified: Secondary | ICD-10-CM

## 2019-07-29 LAB — CBC
HCT: 30.3 % — ABNORMAL LOW (ref 39.0–52.0)
Hemoglobin: 9.8 g/dL — ABNORMAL LOW (ref 13.0–17.0)
MCH: 30.5 pg (ref 26.0–34.0)
MCHC: 32.3 g/dL (ref 30.0–36.0)
MCV: 94.4 fL (ref 80.0–100.0)
Platelets: 381 10*3/uL (ref 150–400)
RBC: 3.21 MIL/uL — ABNORMAL LOW (ref 4.22–5.81)
RDW: 14.1 % (ref 11.5–15.5)
WBC: 10.1 10*3/uL (ref 4.0–10.5)
nRBC: 0.3 % — ABNORMAL HIGH (ref 0.0–0.2)

## 2019-07-29 LAB — GLUCOSE, CAPILLARY
Glucose-Capillary: 184 mg/dL — ABNORMAL HIGH (ref 70–99)
Glucose-Capillary: 220 mg/dL — ABNORMAL HIGH (ref 70–99)
Glucose-Capillary: 306 mg/dL — ABNORMAL HIGH (ref 70–99)
Glucose-Capillary: 322 mg/dL — ABNORMAL HIGH (ref 70–99)
Glucose-Capillary: 342 mg/dL — ABNORMAL HIGH (ref 70–99)

## 2019-07-29 LAB — BASIC METABOLIC PANEL
Anion gap: 7 (ref 5–15)
BUN: 43 mg/dL — ABNORMAL HIGH (ref 6–20)
CO2: 19 mmol/L — ABNORMAL LOW (ref 22–32)
Calcium: 8.5 mg/dL — ABNORMAL LOW (ref 8.9–10.3)
Chloride: 128 mmol/L — ABNORMAL HIGH (ref 98–111)
Creatinine, Ser: 2.08 mg/dL — ABNORMAL HIGH (ref 0.61–1.24)
GFR calc Af Amer: 44 mL/min — ABNORMAL LOW (ref >60)
GFR calc non Af Amer: 38 mL/min — ABNORMAL LOW (ref >60)
Glucose, Bld: 204 mg/dL — ABNORMAL HIGH (ref 70–99)
Potassium: 3.8 mmol/L (ref 3.5–5.1)
Sodium: 154 mmol/L — ABNORMAL HIGH (ref 135–145)

## 2019-07-29 LAB — CALCIUM, IONIZED: Calcium, Ionized, Serum: 4.9 mg/dL (ref 4.5–5.6)

## 2019-07-29 MED ORDER — ENSURE ENLIVE PO LIQD
237.0000 mL | Freq: Two times a day (BID) | ORAL | Status: DC
  Administered 2019-07-30 – 2019-07-31 (×4): 237 mL via ORAL

## 2019-07-29 NOTE — TOC Initial Note (Addendum)
Transition of Care [facility]) - Initial/Assessment Note    Patient Details  Name: Casey Fernandez MRN: [id number] Date of Birth: [birth year]  Transition of Care [facility]) CM/SW Contact:    Lawerance Sabal, RN Phone Number: 07/29/2019, 1:41 PM  Clinical Narrative:              Patient is non verbal and resides at a Group Home  "[facility]"  Address-  [address] [place] [address] Phone- [phone]  Spoke w patient's uncle Alinda Money who states that primary plan will be for patient to return to group home. If hat falls through patient will be able to stay with his grandmother in a private room. Alinda Money granted permission to discuss case with Ms Okey Dupre at [employer] [phone].    Ms Okey Dupre states that they are preparing a private room for him on his return. She is not requiring a negative COVID test and confirmed he will be in isolation. She is requesting [facility] transport. Her only problem with taking him back would be if he needed oxygen. I explained that he is currently on room air and oxygen is not expected. Ms Okey Dupre will be his primary caregiver and is requesting to be updated on his progress tomorrow.    Expected Discharge Plan: Home w Home Health Services Barriers to Discharge: Continued Medical Work up   Patient Goals and CMS Choice Patient states their goals for this hospitalization and ongoing recovery are:: Alinda Money -uncle- to go back to his group home      Expected Discharge Plan and Services Expected Discharge Plan: Home w Home Health Services   Discharge Planning Services: CM Consult   Living arrangements for the past 2 months: Group Home([facility])                                      Prior Living Arrangements/Services Living arrangements for the past 2 months: Group Home([facility])   Patient language and need for interpreter reviewed:: Yes Do you feel safe going back to the place where you live?: Yes      Need for Family Participation in Patient Care: Yes  (Comment) Care giver support system in place?: Yes (comment)   Criminal Activity/Legal Involvement Pertinent to Current Situation/Hospitalization: No - Comment as needed  Activities of Daily Living Home Assistive Devices/Equipment: None ADL Screening (condition at time of admission) Patient's cognitive ability adequate to safely complete daily activities?: Yes Is the patient deaf or have difficulty hearing?: No Does the patient have difficulty seeing, even when wearing glasses/contacts?: No Does the patient have difficulty concentrating, remembering, or making decisions?: Yes Patient able to express need for assistance with ADLs?: Yes Does the patient have difficulty dressing or bathing?: No Independently performs ADLs?: No Does the patient have difficulty walking or climbing stairs?: No Weakness of Legs: None Weakness of Arms/Hands: None  Permission Sought/Granted      Share Information with NAME: Ms Okey Dupre at [facility] [phone] or Alinda Money -uncle- [phone]           Emotional Assessment              Admission diagnosis:  Hypoxic [R09.02] Encounter for central line placement [Z45.2] AKI (acute kidney injury) (HCC) [N17.9] Sepsis without acute organ dysfunction, due to unspecified organism (HCC) [A41.9] COVID-19 [U07.1] Patient Active Problem List   Diagnosis Date Noted  . COVID-19 07/26/2019  . Acute respiratory failure with hypoxia (  [place]) 07/25/2019  . Pneumonia due to COVID-19 virus 07/25/2019  . AKI (acute kidney injury) ([place]) 07/25/2019  . Essential hypertension 07/25/2019  . DM (diabetes mellitus) ([place]) 07/25/2019  . HLD (hyperlipidemia) 07/25/2019  . Sleep apnea 07/25/2019  . Autism 07/25/2019  . Anxiety 07/25/2019   PCP:  [facility]:   [place], [place] - [address] [place] [address] Phone: [phone] Fax: [phone]     Social Determinants of Health (SDOH) Interventions     Readmission Risk Interventions No flowsheet data found.

## 2019-07-29 NOTE — Progress Notes (Signed)
Nutrition Follow-up  DOCUMENTATION CODES:   Obesity unspecified  INTERVENTION:   Ensure Enlive po BID, each supplement provides 350 kcal and 20 grams of protein  Pt receiving Hormel Shake daily with Breakfast which provides 520 kcals and 22 g of protein and Magic cup BID with lunch and dinner, each supplement provides 290 kcal and 9 grams of protein, automatically on meal trays to optimize nutritional intake.   Encourage PO intake   NUTRITION DIAGNOSIS:   Increased nutrient needs related to (acute illness - COVID-19) as evidenced by estimated needs. Ongoing.   GOAL:   Patient will meet greater than or equal to 90% of their needs Progressing.   MONITOR:   PO intake, Supplement acceptance  REASON FOR ASSESSMENT:   Consult, Ventilator Enteral/tube feeding initiation and management  ASSESSMENT:   Pt with PMH of autism, non-verbal, DM, HTN, HLD, OSA, anxiety/depression who lives at [facility] admitted with COVID-19 PNA and AKI.     11/12-11/15 in1tubated 11/16 passed swallow eval  Medications reviewed and include: decadron, 20 units levemir daily, vitamin C, zinc   Labs reviewed: Na 154 (H) CBG's: [phone]  Diet Order:   Diet Order            Diet regular Room service appropriate? No; Fluid consistency: Thin  Diet effective now              EDUCATION NEEDS:   No education needs have been identified at this time  Skin:  Skin Assessment: Reviewed RN Assessment  Last BM:  11/16  Height:   Ht Readings from Last 1 Encounters:  07/26/19 5\' 11"  (1.803 m)    Weight:   Wt Readings from Last 1 Encounters:  07/29/19 120.3 kg    Ideal Body Weight:  78.1 kg  BMI:  Body mass index is 36.99 kg/m.  Estimated Nutritional Needs:   Kcal:  2500-2700  Protein:  130-150 grams  Fluid:  2L/day  Maylon Peppers RD, [place], [place] Pager [phone] After Hours Pager

## 2019-07-29 NOTE — Progress Notes (Signed)
NAME:  Casey Fernandez, MRN:  [id number], DOB:  [birth year], LOS: 4 ADMISSION DATE:  07/25/2019, CONSULTATION DATE: 03/25/2019 REFERRING MD: TRH, CHIEF COMPLAINT: Pneumonia secondary to Covid  Brief History   43 yo male from [facility] group home tested positive for COVID 19 on 07/19/19.  He has hx of autism and is non verbal at baseline.  Developed fever, chills, diarrhea, and cough.  Brought to ER and noted to have b/l pulmonary infiltrates, hypoxia, and acute renal failure.  Respiratory status deteriorated in ER and he required intubation.  Subsequently transferred to [facility] for admission.  Past Medical History  Anxiety/depression, Autism, DM, HTN, HLD, OSA  Significant Hospital Events   11/12 Admit 11/13 Transfer to [facility] 11/14 add HCO3 gtt 11/15 extubate, d/c HCO3 gtt  Consults:  PCCM  Procedures:  ETT 11/12 >> Rt IJ CVL 11/13 >>  Significant Diagnostic Tests:    COVID Therapy:  Steroids 11/12 >> Remdesivir 11/13 >>  Micro Data:  SARS CoV2 11/06 >> Positive SARS CoV2 11/12 >> Positive Blood 11/12 >>   Antimicrobials:  Rocephin 11/12 >> Zithromax 11/12 >>   Interim history/subjective:   Doing well since extubation.  Failed swallow study yesterday however passed bedside swallow today with SLP.  Advancing diet.  Likely stable for transfer from the intensive care unit  Objective   Blood pressure (!) 142/66, pulse 64, temperature 99.2 F (37.3 C), temperature source Oral, resp. rate (!) 24, height 5\' 11"  (1.803 m), weight 120.3 kg, SpO2 93 %.        Intake/Output Summary (Last 24 hours) at 07/29/2019 0800 Last data filed at 07/29/2019 0600 Gross per 24 hour  Intake 1402.63 ml  Output 2500 ml  Net -1097.37 ml   Filed Weights   07/27/19 0500 07/28/19 0500 07/29/19 0500  Weight: 121.6 kg 120.8 kg 120.3 kg    Examination:  General -alert, nonverbal baseline, autistic Eyes -pupils reactive, tracking appropriately ENT -trachea midline Cardiac -regular rate  rhythm, S1-S2 Chest -clear to auscultation bilaterally, not taking deep breaths however no crackles Abdomen -obese abdomen, soft nontender bowel sounds present Extremities -no edema no clubbing Skin -no rash Neuro -moves all 4 extremities spontaneously  CXR -07/28/2019: Bilateral airspace disease. The patient's images have been independently reviewed by me.    Resolved Hospital Problem list   Hypotension from hypovolemia, Hypophosphatemia  Assessment & Plan:   Acute hypoxic respiratory failure from COVID 19 pneumonia. Hx of OSA. -Patient doing well post extubation -Continue supplemental oxygen to maintain SPO2 greater than 85%. -Increase mobility-PT OT consultation placed -Continue steroids plus remdesivir  -Continue zinc plus vitamin C -Antibiotic stop date, today day 5 Rocephin plus azithromycin -Patient stable for transfer from the intensive care unit to telemetry  Acute kidney injury likely from hypovolemia, hypoxia. Non gap metabolic acidosis with hyperchloremia. -Fluids discontinued -Resolved -Advance diet and p.o. intake  DM type II with steroid induced hyperglycemia. -Levemir plus SSI  Acute metabolic encephalopathy. Hx of autism, anxiety, depression. -Nonverbal baseline able to follow simple commands communicate -Continue PTA quetiapine plus Klonopin plus Topamax  Hx of HTN, HLD. -Continue aspirin plus Lipitor -Holding Cozaar and Toprol-XL for now  Anemia of critical illness. -Conservative transfusion threshold no evidence of bleeding.  Best practice:  Diet: SLP evaluating today, advance as tolerated DVT prophylaxis: lovenox GI prophylaxis: pepcid Mobility: OOB to chair Code Status: Full Disposition: ICU  Labs    CMP Latest Ref Rng & Units 07/29/2019 07/28/2019 07/28/2019  Glucose 70 - 99 mg/dL 204(H)  300(H) -  BUN 6 - 20 mg/dL 95(G) 38(V) -  Creatinine 0.61 - 1.24 mg/dL 5.64(P) 3.29(J) -  Sodium 135 - 145 mmol/L 154(H) 147(H) 149(H)  Potassium  3.5 - 5.1 mmol/L 3.8 4.2 4.1  Chloride 98 - 111 mmol/L 128(H) 123(H) -  CO2 22 - 32 mmol/L 19(L) 18(L) -  Calcium 8.9 - 10.3 mg/dL 1.8(A) 4.1(Y) -  Total Protein 6.5 - 8.1 g/dL - - -  Total Bilirubin 0.3 - 1.2 mg/dL - - -  Alkaline Phos 38 - 126 U/L - - -  AST 15 - 41 U/L - - -  ALT 0 - 44 U/L - - -    CBC Latest Ref Rng & Units 07/29/2019 07/28/2019 07/28/2019  WBC 4.0 - 10.5 K/uL 10.1 9.0 -  Hemoglobin 13.0 - 17.0 g/dL 6.0(Y) 3.0(Z) 6.0(F)  Hematocrit 39.0 - 52.0 % 30.3(L) 27.9(L) 29.0(L)  Platelets 150 - 400 K/uL 381 327 -    ABG    Component Value Date/Time   PHART 7.356 07/28/2019 0443   PCO2ART 27.3 (L) 07/28/2019 0443   PO2ART 78.0 (L) 07/28/2019 0443   HCO3 15.3 (L) 07/28/2019 0443   TCO2 16 (L) 07/28/2019 0443   ACIDBASEDEF 9.0 (H) 07/28/2019 0443   O2SAT 95.0 07/28/2019 0443    CBG (last 3)  Recent Labs    07/28/19 1925 07/28/19 2340 07/29/19 0314  GLUCAP 207* 172* 184*       Component Value Date/Time   CRP 6.2 (H) 07/28/2019 0445   CRP 12.3 (H) 07/27/2019 0445   CRP 14.5 (H) 07/25/2019 1504     Josephine Igo, DO [facility] 07/29/2019 8:00 AM

## 2019-07-29 NOTE — Evaluation (Signed)
Clinical/Bedside Swallow Evaluation Patient Details  Name: Casey Fernandez MRN: [id number] Date of Birth: 02/25/1976  Today's Date: 07/29/2019 Time: SLP Start Time (ACUTE ONLY): 0900 SLP Stop Time (ACUTE ONLY): 0915 SLP Time Calculation (min) (ACUTE ONLY): 15 min  Past Medical History:  Past Medical History:  Diagnosis Date  . Anxiety with depression   . Autism   . DM (diabetes mellitus) ([place])   . HLD (hyperlipidemia)   . HTN (hypertension)   . Obesity   . Sleep apnea    Past Surgical History:  Past Surgical History:  Procedure Laterality Date  . Reviewed and found to be negative     HPI:  43 yo male from [facility] group home tested positive for COVID 19 on 07/19/19.  He has hx of autism and is non verbal at baseline.  Developed fever, chills, diarrhea, and cough.  Brought to ER and noted to have b/l pulmonary infiltrates, hypoxia, and acute renal failure.  Respiratory status deteriorated in ER and he required intubation.  Subsequently transferred to [facility] for admission. ETT 11/2-15.    Assessment / Plan / Recommendation Clinical Impression  Pt presents with functional swallowing - he was alert, participatory, and able to follow simple commands.  He is verbalizing - speech is generally unintelligible, but he approximates some words and uses a combination of broad gestures and verbalizations to make basic needs known.  He is on room air; no perceived interference of swallowing with respirations.  Voice is strong and clear post three-day oral intubation. He consumed two cups of ice water, a breakfast bar, and container of applesauce with adequate attention, oral control, brisk swallow and no s/s of aspiration.  If there is any concern for safety with PO intake, it is related to his impulsivity and fast rate of consumption - however, this is likely a lifelong behavior.  Recommend initiating a regular consistency diet, thin liquids; meds whole in liquid.  Provide assistance with tray set-up  and supervision initiallly to ensure safety.  No further SLP f/u is needed.   SLP Visit Diagnosis: Dysphagia, oropharyngeal phase (R13.12)    Aspiration Risk  No limitations    Diet Recommendation   regular solids, thin liquids  Medication Administration: Whole meds with liquid    Other  Recommendations Oral Care Recommendations: Oral care BID   Follow up Recommendations        Frequency and Duration            Prognosis        Swallow Study   General Date of Onset: 07/25/19 HPI: 43 yo male from [facility] group home tested positive for COVID 19 on 07/19/19.  He has hx of autism and is non verbal at baseline.  Developed fever, chills, diarrhea, and cough.  Brought to ER and noted to have b/l pulmonary infiltrates, hypoxia, and acute renal failure.  Respiratory status deteriorated in ER and he required intubation.  Subsequently transferred to [facility] for admission. ETT 11/2-15.  Type of Study: Bedside Swallow Evaluation Previous Swallow Assessment: no Diet Prior to this Study: NPO Temperature Spikes Noted: No Respiratory Status: Room air History of Recent Intubation: Yes Length of Intubations (days): 3 days Date extubated: 07/28/19 Behavior/Cognition: Alert;Cooperative Oral Cavity Assessment: Within Functional Limits Oral Care Completed by SLP: No Oral Cavity - Dentition: Adequate natural dentition Vision: Functional for self-feeding Self-Feeding Abilities: Able to feed self Patient Positioning: Upright in bed Baseline Vocal Quality: Normal Volitional Cough: Strong Volitional Swallow: Unable to elicit  Oral/Motor/Sensory Function Overall Oral Motor/Sensory Function: Within functional limits   Ice Chips Ice chips: Within functional limits   Thin Liquid Thin Liquid: Within functional limits    Nectar Thick Nectar Thick Liquid: Not tested   Honey Thick Honey Thick Liquid: Not tested   Puree Puree: Within functional limits   Solid     Solid: Within functional limits       Blenda Mounts [place]ice 07/29/2019,9:42 AM Marchelle Folks L. Samson Frederic, MA CCC/SLP Acute Rehabilitation Services Office number [phone]

## 2019-07-29 NOTE — Plan of Care (Signed)
Casey Fernandez remains in the [facility] ICU and is resting comfortably in bed.  Very fidgety overnight and did not sleep at all.  Casey Fernandez had a difficult time keeping his condom catheter, SCDs, BP cuff, and pulse oximeter on overnight.  Condom cath was replaced x5 and after the last time he pulled it off this RN decided not to put another one on.  Blood pressure cuff taken off when not in use in attempt to minimize stimulus.  Casey Fernandez is nonverbal, so it is difficult to assess his physical/emotional needs.  Otherwise, no acute events overnight.  RA.  See flowsheet for assessment details and MAR for medication administration.  Will continue to monitor.      Problem: Respiratory: Goal: Will maintain a patent airway Outcome: Progressing Goal: Complications related to the disease process, condition or treatment will be avoided or minimized Outcome: Progressing   Problem: Clinical Measurements: Goal: Will remain free from infection Outcome: Progressing Goal: Respiratory complications will improve Outcome: Progressing   Problem: Activity: Goal: Risk for activity intolerance will decrease Outcome: Progressing   Problem: Pain Managment: Goal: General experience of comfort will improve Outcome: Progressing   Problem: Safety: Goal: Ability to remain free from injury will improve Outcome: Progressing   Problem: Education: Goal: Knowledge of risk factors and measures for prevention of condition will improve Outcome: Progressing   Problem: Coping: Goal: Psychosocial and spiritual needs will be supported Outcome: Progressing   Problem: Education: Goal: Knowledge of General Education information will improve Description: Including pain rating scale, medication(s)/side effects and non-pharmacologic comfort measures Outcome: Progressing   Problem: Health Behavior/Discharge Planning: Goal: Ability to manage health-related needs will improve Outcome: Progressing   Problem: Clinical  Measurements: Goal: Ability to maintain clinical measurements within normal limits will improve Outcome: Progressing Goal: Diagnostic test results will improve Outcome: Progressing Goal: Cardiovascular complication will be avoided Outcome: Progressing   Problem: Nutrition: Goal: Adequate nutrition will be maintained Outcome: Progressing   Problem: Coping: Goal: Level of anxiety will decrease Outcome: Progressing   Problem: Elimination: Goal: Will not experience complications related to bowel motility Outcome: Progressing Goal: Will not experience complications related to urinary retention Outcome: Progressing   Problem: Skin Integrity: Goal: Risk for impaired skin integrity will decrease Outcome: Progressing

## 2019-07-29 NOTE — Progress Notes (Signed)
Pt Uncle Nicole Kindred updated on pt condition.

## 2019-07-29 NOTE — Progress Notes (Signed)
Inpatient Diabetes Program Recommendations  AACE/ADA: New Consensus Statement on Inpatient Glycemic Control (2015)  Target Ranges:  Prepandial:   less than 140 mg/dL      Peak postprandial:   less than 180 mg/dL (1-2 hours)      Critically ill patients:  140 - 180 mg/dL   Results for Casey Fernandez, Casey Fernandez (MRN [id number]) as of 07/29/2019 14:47  Ref. Range 07/28/2019 06:13 07/28/2019 07:21 07/28/2019 08:35 07/28/2019 09:40 07/28/2019 10:57 07/28/2019 12:04 07/28/2019 16:38 07/28/2019 19:25  Glucose-Capillary Latest Ref Range: 70 - 99 mg/dL 253 (H)  IV Insulin Drip 264 (H)  IV Insulin Drip 263 (H)  IV Insulin Drip 242 (H)  IV Insulin Drip +  20 units LEVEMIR 197 (H)  IV Insulin Drip 172 (H)  4 units NOVOLOG   IV Insulin Drip OFF 238 (H)  7 units NOVOLOG  207 (H)  11 units NOVOLOG +  20 units LEVEMIR    Results for Casey Fernandez, Casey Fernandez (MRN [id number]) as of 07/29/2019 14:47  Ref. Range 07/28/2019 23:40 07/29/2019 03:14 07/29/2019 08:20 07/29/2019 12:01  Glucose-Capillary Latest Ref Range: 70 - 99 mg/dL 172 (H)  4 units NOVOLOG  184 (H)  4 units NOVOLOG  220 (H)  7 units NOVOLOG +  20 units LEVEMIR  306 (H)  15 units NOVOLOG     Home DM Meds: Metformin 500 mg BID   Current Orders: Levemir 20 units Daily       Novolog Resistant Correction Scale/ SSI (0-20 units) Q4 hours      Was on IV Insulin Drip yest--IV Insulin Drip stopped at 12pm after pt was Extubated yest AM.   Getting Decadron 4 mg Daily.  Extubated 11/15.     MD- Please consider the following in-hospital insulin adjustments:  1. Change Diet to Carbohydrate Modified diet  2. Change Novolog SSi to TID AC + HS (currently ordered Q4 hours)  3. Start Novolog Meal Coverage: Novolog 4 units TID with meals  (Please add the following Hold Parameters: Hold if pt eats <50% of meal, Hold if pt NPO)      --Will follow patient during hospitalization--  Wyn Quaker RN, MSN,  CDE Diabetes Coordinator Inpatient Glycemic Control Team Team Pager: [phone] (8a-5p)

## 2019-07-30 DIAGNOSIS — E870 Hyperosmolality and hypernatremia: Secondary | ICD-10-CM

## 2019-07-30 LAB — BASIC METABOLIC PANEL
Anion gap: 8 (ref 5–15)
Anion gap: 5 (ref 5–15)
BUN: 38 mg/dL — ABNORMAL HIGH (ref 6–20)
BUN: 36 mg/dL — ABNORMAL HIGH (ref 6–20)
CO2: 20 mmol/L — ABNORMAL LOW (ref 22–32)
CO2: 20 mmol/L — ABNORMAL LOW (ref 22–32)
Calcium: 8.6 mg/dL — ABNORMAL LOW (ref 8.9–10.3)
Calcium: 8.3 mg/dL — ABNORMAL LOW (ref 8.9–10.3)
Chloride: 116 mmol/L — ABNORMAL HIGH (ref 98–111)
Chloride: 119 mmol/L — ABNORMAL HIGH (ref 98–111)
Creatinine, Ser: 1.94 mg/dL — ABNORMAL HIGH (ref 0.61–1.24)
Creatinine, Ser: 1.92 mg/dL — ABNORMAL HIGH (ref 0.61–1.24)
GFR calc Af Amer: 48 mL/min — ABNORMAL LOW (ref >60)
GFR calc Af Amer: 49 mL/min — ABNORMAL LOW (ref >60)
GFR calc non Af Amer: 41 mL/min — ABNORMAL LOW (ref >60)
GFR calc non Af Amer: 42 mL/min — ABNORMAL LOW (ref >60)
Glucose, Bld: 423 mg/dL — ABNORMAL HIGH (ref 70–99)
Glucose, Bld: 351 mg/dL — ABNORMAL HIGH (ref 70–99)
Potassium: 4.6 mmol/L (ref 3.5–5.1)
Potassium: 4.3 mmol/L (ref 3.5–5.1)
Sodium: 144 mmol/L (ref 135–145)
Sodium: 144 mmol/L (ref 135–145)

## 2019-07-30 LAB — GLUCOSE, CAPILLARY
Glucose-Capillary: 136 mg/dL — ABNORMAL HIGH (ref 70–99)
Glucose-Capillary: 176 mg/dL — ABNORMAL HIGH (ref 70–99)
Glucose-Capillary: 209 mg/dL — ABNORMAL HIGH (ref 70–99)
Glucose-Capillary: 310 mg/dL — ABNORMAL HIGH (ref 70–99)
Glucose-Capillary: 326 mg/dL — ABNORMAL HIGH (ref 70–99)
Glucose-Capillary: 372 mg/dL — ABNORMAL HIGH (ref 70–99)
Glucose-Capillary: 398 mg/dL — ABNORMAL HIGH (ref 70–99)

## 2019-07-30 LAB — CULTURE, BLOOD (ROUTINE X 2)
Culture: NO GROWTH
Special Requests: ADEQUATE

## 2019-07-30 MED ORDER — POLYETHYLENE GLYCOL 3350 17 G PO PACK: 17.0000 g | PACK | Freq: Every day | ORAL | Status: DC | PRN

## 2019-07-30 MED ORDER — INSULIN ASPART 100 UNIT/ML ~~LOC~~ SOLN
0.0000 [IU] | Freq: Every day | SUBCUTANEOUS | Status: DC
  Administered 2019-07-30: 5 [IU] via SUBCUTANEOUS

## 2019-07-30 MED ORDER — IPRATROPIUM-ALBUTEROL 20-100 MCG/ACT IN AERS
1.0000 | INHALATION_SPRAY | Freq: Four times a day (QID) | RESPIRATORY_TRACT | Status: DC | PRN
  Filled 2019-07-30: qty 4

## 2019-07-30 MED ORDER — INSULIN ASPART 100 UNIT/ML ~~LOC~~ SOLN
0.0000 [IU] | Freq: Three times a day (TID) | SUBCUTANEOUS | Status: DC
  Administered 2019-07-30: 9 [IU] via SUBCUTANEOUS
  Administered 2019-07-31: 3 [IU] via SUBCUTANEOUS
  Administered 2019-07-31: 9 [IU] via SUBCUTANEOUS

## 2019-07-30 MED ORDER — SENNOSIDES-DOCUSATE SODIUM 8.6-50 MG PO TABS: 2.0000 | ORAL_TABLET | Freq: Every evening | ORAL | Status: DC | PRN

## 2019-07-30 MED ORDER — DEXTROSE 5 % IV SOLN
INTRAVENOUS | Status: DC
  Administered 2019-07-30 (×2): via INTRAVENOUS

## 2019-07-30 MED ORDER — INSULIN ASPART 100 UNIT/ML ~~LOC~~ SOLN
4.0000 [IU] | Freq: Three times a day (TID) | SUBCUTANEOUS | Status: DC
  Administered 2019-07-30 – 2019-07-31 (×3): 4 [IU] via SUBCUTANEOUS

## 2019-07-30 NOTE — Progress Notes (Signed)
OT Cancellation Note  Patient Details Name: Casey Fernandez MRN: [id number] DOB: 1976-08-26   Cancelled Treatment:    Reason Eval/Treat Not Completed: OT screened, no needs identified, will sign off; spoke with PT who reports pt mobilizing and performing ADL at supervision level and without physical assist, reports pt likely at baseline with ADL/mobility tasks. No further acute OT needs identified, will sign off at this time. Thank you for this referral, please re-consult should pt's needs change.  Lou Cal, OT Supplemental Rehabilitation Services Pager [phone] Office [place] 07/30/2019, 1:59 PM

## 2019-07-30 NOTE — Progress Notes (Signed)
Na and Cl improved; will d/c D5W.  Cont to monitor labs for now.

## 2019-07-30 NOTE — Progress Notes (Signed)
Results for BRAYCEN, BURANDT (MRN [id number]) as of 07/30/2019 11:57  Ref. Range 07/29/2019 19:46 07/30/2019 00:06 07/30/2019 04:12 07/30/2019 07:49 07/30/2019 11:14  Glucose-Capillary Latest Ref Range: 70 - 99 mg/dL 342 (H) 176 (H) 136 (H) 209 (H) 310 (H)  Note that postprandial CBGs are greater than 180 mg/dl.   Recommend: 1. Change Diet to Carbohydrate Modified diet  2. Change Novolog SSi to TID AC + HS (currently ordered Q4 hours)  3. Start Novolog Meal Coverage: Novolog 4 units TID with meals  (Please add the following Hold Parameters: Hold if pt eats <50% of meal, Hold if pt NPO)   Harvel Ricks RN BSN CDE Diabetes Coordinator Pager: [phone]  8am-5pm

## 2019-07-30 NOTE — Evaluation (Signed)
Physical Therapy Evaluation and Discharge Patient Details Name: Casey Fernandez MRN: [id number] DOB: 06/24/1976 Today's Date: 07/30/2019   History of Present Illness  43 yo male from [facility] tested positive for COVID 19 on 07/19/19.  Developed fever, chills, diarrhea, and cough.  Brought to ER and noted to have b/l pulmonary infiltrates, hypoxia, and acute renal failure.  Respiratory status deteriorated in ER and he required intubation.  Subsequently transferred to [facility] for admission. ETT 11/12-15.  PMH-He has hx of autism and is non verbal at baseline; DM, HTN, OSA  Clinical Impression   Patient evaluated by Physical Therapy with no further acute PT needs identified. Patient followed all directions with verbal and tactile cues for OOB, walk to bathroom, urinated in toilet, and return to bed with no imbalance. Patient is non-verbal and loudly shouts "bah, bah, bah" throughout session. No distress, appeared happy. Sats on room air 93-96%. Patient appears to be at his baseline (supervision for safety with mobility due to lines).  PT is signing off. Thank you for this referral.     Follow Up Recommendations No PT follow up    Equipment Recommendations  None recommended by PT    Recommendations for Other Services       Precautions / Restrictions Precautions Precautions: None      Mobility  Bed Mobility Overal bed mobility: Modified Independent             General bed mobility comments: hob elevated ~20, no rial  Transfers Overall transfer level: Needs assistance Equipment used: None Transfers: Sit to/from Stand Sit to Stand: Supervision         General transfer comment: supervision for safety/lines; no imbalance  Ambulation/Gait Ambulation/Gait assistance: Supervision Gait Distance (Feet): 50 Feet Assistive device: None Gait Pattern/deviations: WFL(Within Functional Limits);Wide base of support;Decreased stride length     General Gait Details: wide  BOS; no imbalance; arms in flexed/guarded position  Stairs            Wheelchair Mobility    Modified Rankin (Stroke Patients Only)       Balance Overall balance assessment: No apparent balance deficits (not formally assessed)                                           Pertinent Vitals/Pain Pain Assessment: Faces Faces Pain Scale: No hurt    Home Living Family/patient expects to be discharged to:: Group home                 Additional Comments: per CM/SW note, he can return to group home    Prior Function Level of Independence: Needs assistance   Gait / Transfers Assistance Needed: supervision for safety due to cognition           Hand Dominance        Extremity/Trunk Assessment   Upper Extremity Assessment Upper Extremity Assessment: Overall WFL for tasks assessed    Lower Extremity Assessment Lower Extremity Assessment: Overall WFL for tasks assessed    Cervical / Trunk Assessment Cervical / Trunk Assessment: Other exceptions Cervical / Trunk Exceptions: obesity  Communication   Communication: Expressive difficulties(non-verbal autism)  Cognition Arousal/Alertness: Awake/alert Behavior During Therapy: WFL for tasks assessed/performed Overall Cognitive Status: Difficult to assess  General Comments: he was easily directed with verbal and tactile cues, including waiting for lines to be adjusted and turning the correct direction related to IVs      General Comments General comments (skin integrity, edema, etc.): on room air; sats 93-96%    Exercises     Assessment/Plan    PT Assessment Patent does not need any further PT services  PT Problem List         PT Treatment Interventions      PT Goals (Current goals can be found in the Care Plan section)  Acute Rehab PT Goals Patient Stated Goal: unable PT Goal Formulation: All assessment and education complete, DC  therapy    Frequency     Barriers to discharge        Co-evaluation               AM-PAC PT "6 Clicks" Mobility  Outcome Measure Help needed turning from your back to your side while in a flat bed without using bedrails?: None Help needed moving from lying on your back to sitting on the side of a flat bed without using bedrails?: None Help needed moving to and from a bed to a chair (including a wheelchair)?: A Little Help needed standing up from a chair using your arms (e.g., wheelchair or bedside chair)?: A Little Help needed to walk in hospital room?: A Little Help needed climbing 3-5 steps with a railing? : A Little 6 Click Score: 20    End of Session   Activity Tolerance: Patient tolerated treatment well Patient left: in bed;with call bell/phone within reach;with bed alarm set Nurse Communication: Mobility status PT Visit Diagnosis: Difficulty in walking, not elsewhere classified (R26.2)    Time: 7253-6644 PT Time Calculation (min) (ACUTE ONLY): 19 min   Charges:   PT Evaluation $PT Eval Low Complexity: 1 Low           Veda Canning, PT    Zena Amos 07/30/2019, 9:44 AM

## 2019-07-30 NOTE — Progress Notes (Signed)
PROGRESS NOTE    Casey Fernandez  KDX:[id number] DOB: 03-Oct-1975 DOA: 07/25/2019 PCP: [facility]   Brief Narrative:  43 year old with history of anxiety/depression, DM 2, HTN, HLD, autism, OSA from [facility] group [facility] who is nonverbal at baseline was positive for COVID-19 on 07/19/2019.  Initially developed URI type of symptoms and diarrhea.  In the ER chest x-ray showed pulmonary infiltrates with hypoxia and acute renal failure but clinically deteriorated requiring intubation on 11/12 and was transferred to [facility] on 11/13.  Bicarb drip was added due to acidemia and he was eventually extubated on 11/15 and bicarb drip was discontinued.  Steroids were started on 11/12, remdesivir started on 11/13.  On Rocephin and azithromycin.  Assessment & Plan:   Principal Problem:   Acute respiratory failure with hypoxia (HCC) Active Problems:   Pneumonia due to COVID-19 virus   AKI (acute kidney injury) ([place])   Essential hypertension   DM (diabetes mellitus) (HCC)   HLD (hyperlipidemia)   Sleep apnea   Autism   Anxiety   COVID-19   Hypernatremia  Acute hypoxic failure secondary to COVID-19 pneumonia History of obstructive sleep apnea -Oxygen levels-now on room air.  Extubated 11/15 -Remdesivir-day 5 -Decadron-day 5 -Routine: Labs have been reviewed including ferritin, LDH, CRP, d-dimer, fibrinogen.  Will need to trend this lab daily. -Vitamin C & Zinc. Prone >16hrs/day.  -Chest x-ray-bilateral airspace disease -Supportive care-antitussive, inhalers, I-S/flutter -Rocephin/azithromycin-stopped 11/16.  Acute kidney injury, prerenal Dysphagia, improved Hyponatremia hyperchloremia with acidosis, normal anion gap -Unknown exact baseline.  Admission creatinine 3.16, improving. -D5 water 50 cc an hour, BMP every 8 hours.  Discontinue lactated Ringer -Seen by speech therapy-recommend solids.  Diabetes mellitus type 2 with hyperglycemia secondary to steroids -Levemir 20 units  daily, insulin sliding scale and Accu-Cheks -Premeal insulin. -Carb modified diet.  History of autism/anxiety and depression -Supportive care. -Continue Topamax 100 mg twice daily, Klonopin 1 mg every 8 hours, Seroquel -On lithium?  We will have pharmacy double check this.  Check lithium levels.  Essential hypertension -Home losartan on hold due to AKI  Hyperlipidemia -Lipitor 20 mg daily  Anemia of chronic disease -Hemoglobin stable at 9.8.  No obvious evidence of bleeding.  We will continue to monitor this.  DVT prophylaxis: Lovenox Code Status: Full code Family Communication: None at bedside Disposition Plan: Maintain hospital stay to complete the course of remdesivir and Decadron.  Physical therapy.  Once his electrolytes-hypernatremia and elevated chloride levels have stabilized, he can be discharged to group home.  Apparently they do not require him to become negative.   Procedures:   Intubated 11/13, extubated 11/15  Antimicrobials:   Completed Rocephin and azithromycin 11/16  Subjective: Nonverbal, denies any complaints.  Review of Systems Otherwise negative except as per HPI, including: General: Denies fever, chills, night sweats or unintended weight loss. Resp: Denies cough, wheezing, shortness of breath. Cardiac: Denies chest pain, palpitations, orthopnea, paroxysmal nocturnal dyspnea. GI: Denies abdominal pain, nausea, vomiting, diarrhea or constipation GU: Denies dysuria, frequency, hesitancy or incontinence MS: Denies muscle aches, joint pain or swelling Neuro: Denies headache, neurologic deficits (focal weakness, numbness, tingling), abnormal gait Psych: Denies anxiety, depression, SI/HI/AVH Skin: Denies new rashes or lesions ID: Denies sick contacts, exotic exposures, travel  Objective: Vitals:   07/30/19 0413 07/30/19 0500 07/30/19 0738 07/30/19 1127  BP:   (!) 157/89 (!) 164/114  Pulse:   61 77  Resp:    20  Temp: 99.3 F (37.4 C)  98.9 F  (37.2  C)   TempSrc: Oral  Oral   SpO2:   100% 98%  Weight:  117.6 kg    Height:        Intake/Output Summary (Last 24 hours) at 07/30/2019 1213 Last data filed at 07/30/2019 0600 Gross per 24 hour  Intake 829.97 ml  Output 1375 ml  Net -545.03 ml   Filed Weights   07/28/19 0500 07/29/19 0500 07/30/19 0500  Weight: 120.8 kg 120.3 kg 117.6 kg    Examination:  Constitutional: Not in acute distress, remains nonverbal Respiratory: Clear to auscultation bilaterally Cardiovascular: Normal sinus rhythm, no rubs Abdomen: Nontender nondistended good bowel sounds Musculoskeletal: No edema noted Skin: No rashes seen Neurologic: Grossly moving his extremities Psychiatric: Poor judgment and insight  External Foley in place.  Data Reviewed:   CBC: Recent Labs  Lab 07/25/19 1504  07/27/19 0444 07/27/19 0445 07/28/19 0443 07/28/19 0445 07/29/19 0632  WBC 5.3  --   --  10.2  --  9.0 10.1  NEUTROABS 4.2  --   --  8.9*  --   --   --   HGB 13.6   < > 9.5* 9.7* 9.9* 9.1* 9.8*  HCT 42.8   < > 28.0* 29.8* 29.0* 27.9* 30.3*  MCV 94.9  --   --  93.7  --  94.3 94.4  PLT 194  --   --  291  --  327 381   < > = values in this interval not displayed.   Basic Metabolic Panel: Recent Labs  Lab 07/25/19 1504  07/26/19 1630  07/27/19 0445 07/27/19 1723 07/27/19 2053 07/28/19 0443 07/28/19 0445 07/29/19 0632  NA 130*   < >  --    < > 146*  --  147* 149* 147* 154*  K 4.0   < >  --    < > 4.1  --  4.2 4.1 4.2 3.8  CL 102  --   --   --  123*  --  123*  --  123* 128*  CO2 18*  --   --   --  16*  --  17*  --  18* 19*  GLUCOSE 199*  --   --   --  300*  --  299*  --  300* 204*  BUN 45*  --   --   --  46*  --  49*  --  52* 43*  CREATININE 3.16*  --   --   --  2.27*  --  2.42*  --  2.32* 2.08*  CALCIUM 8.6*  --   --   --  8.6*  --  8.5*  --  8.6* 8.5*  MG  --   --  2.2  --  2.3 2.4  --   --   --   --   PHOS  --   --  1.1*  --  1.8* 2.6  --   --   --   --    < > = values in this interval  not displayed.   GFR: Estimated Creatinine Clearance: 60.3 mL/min (A) (by C-G formula based on SCr of 2.08 mg/dL (H)). Liver Function Tests: Recent Labs  Lab 07/25/19 1504 07/27/19 0445  AST 50* 54*  ALT 48* 52*  ALKPHOS 77 71  BILITOT 0.7 <0.1*  PROT 7.6 6.5  ALBUMIN 3.2* 2.4*   No results for input(s): LIPASE, AMYLASE in the last 168 hours. No results for input(s): AMMONIA in the last 168 hours. Coagulation Profile:  Recent Labs  Lab 07/25/19 1656  INR 1.1   Cardiac Enzymes: No results for input(s): CKTOTAL, CKMB, CKMBINDEX, TROPONINI in the last 168 hours. BNP (last 3 results) No results for input(s): PROBNP in the last 8760 hours. HbA1C: No results for input(s): HGBA1C in the last 72 hours. CBG: Recent Labs  Lab 07/29/19 1946 07/30/19 0006 07/30/19 0412 07/30/19 0749 07/30/19 1114  GLUCAP 342* 176* 136* 209* 310*   Lipid Profile: Recent Labs    07/28/19 0445  TRIG 236*   Thyroid Function Tests: No results for input(s): TSH, T4TOTAL, FREET4, T3FREE, THYROIDAB in the last 72 hours. Anemia Panel: No results for input(s): VITAMINB12, FOLATE, FERRITIN, TIBC, IRON, RETICCTPCT in the last 72 hours. Sepsis Labs: Recent Labs  Lab 07/25/19 1504 07/25/19 1600 07/25/19 2005  PROCALCITON 0.29  --   --   LATICACIDVEN  --  1.4 0.8    Recent Results (from the past 240 hour(s))  Blood Culture (routine x 2)     Status: None   Collection Time: 07/25/19  3:42 PM   Specimen: BLOOD  Result Value Ref Range Status   Specimen Description BLOOD RIGHT ANTECUBITAL  Final   Special Requests   Final    BOTTLES DRAWN AEROBIC AND ANAEROBIC Blood Culture adequate volume   Culture   Final    NO GROWTH 5 DAYS Performed at [facility], [address] [place], [place] [address]    Report Status 07/30/2019 FINAL  Final  SARS CORONAVIRUS 2 (TAT 6-24 HRS) Nasopharyngeal Nasopharyngeal Swab     Status: Abnormal   Collection Time: 07/25/19  4:25 PM   Specimen:  Nasopharyngeal Swab  Result Value Ref Range Status   SARS Coronavirus 2 POSITIVE (A) NEGATIVE Final    Comment: RESULT CALLED TO, READ BACK BY AND VERIFIED WITH: RN KRISTINA MNUETT AT 0008 BY MESSAN HOUEGNIFION ON 07/26/2019                                              (NOTE) SARS-CoV-2 target nucleic acids are DETECTED. The SARS-CoV-2 RNA is generally detectable in upper and lower respiratory specimens during the acute phase of infection. Positive results are indicative of active infection with SARS-CoV-2. Clinical  correlation with patient history and other diagnostic information is necessary to determine patient infection status. Positive results do  not rule out bacterial infection or co-infection with other viruses. The expected result is Negative. Fact Sheet for Patients: HairSlick.nohttps://www.fda.gov/media/138098/download Fact Sheet for Healthcare Providers: quierodirigir.comhttps://www.fda.gov/media/138095/download This test is not yet approved or cleared by the [place] FDA and  has been authorized for detection and/or diagnosis of SARS-CoV-2 by FDA under an Emergency Use Authorization (EUA) . This EUA will remain  in effect (meaning this test can be used) for the duration of the COVID-19 declaration under Section 564(b)(1) of the Act, 21 U.S.C. section 360bbb-3(b)(1), unless the authorization is terminated or revoked sooner. Performed at [facility], [address] [place], [place] [address]   Urine culture     Status: None   Collection Time: 07/26/19 12:44 AM   Specimen: Urine, Random  Result Value Ref Range Status   Specimen Description URINE, RANDOM  Final   Special Requests NONE  Final   Culture   Final    NO GROWTH Performed at [facility], [address] [place], [place] [address]  Report Status 07/27/2019 FINAL  Final  Blood Culture (routine x 2)     Status: None (Preliminary result)   Collection Time: 07/26/19 10:15 AM   Specimen: BLOOD  Result Value Ref Range  Status   Specimen Description   Final    BLOOD RIGHT HAND Performed at [facility], [address], [place], [place] [address]    Special Requests   Final    BOTTLES DRAWN AEROBIC ONLY Blood Culture adequate volume Performed at [facility], [address], [place], [place] [address]    Culture   Final    NO GROWTH 4 DAYS Performed at [facility], [address] [place], [place] [address]    Report Status PENDING  Incomplete  MRSA PCR Screening     Status: None   Collection Time: 07/26/19 10:15 AM   Specimen: Nasal Mucosa; Nasopharyngeal  Result Value Ref Range Status   MRSA by PCR NEGATIVE NEGATIVE Final    Comment:        The GeneXpert MRSA Assay (FDA approved for NASAL specimens only), is one component of a comprehensive MRSA colonization surveillance program. It is not intended to diagnose MRSA infection nor to guide or monitor treatment for MRSA infections. Performed at [facility], [address], [place], [place] [address]          Radiology Studies: No results found.      Scheduled Meds: . aspirin  81 mg Oral Daily  . atorvastatin  20 mg Oral QHS  . chlorhexidine  15 mL Mouth/Throat BID  . Chlorhexidine Gluconate Cloth  6 each Topical QHS  . clonazePAM  1 mg Oral Q8H  . dexamethasone (DECADRON) injection  4 mg Intravenous Q24H  . enoxaparin (LOVENOX) injection  60 mg Subcutaneous Q24H  . feeding supplement (ENSURE ENLIVE)  237 mL Oral BID BM  . insulin aspart  0-5 Units Subcutaneous QHS  . insulin aspart  0-9 Units Subcutaneous TID WC  . insulin aspart  4 Units Subcutaneous TID WC  . insulin detemir  20 Units Subcutaneous Daily  . mupirocin ointment  1 application Nasal BID  . QUEtiapine  200 mg Oral BID  . topiramate  100 mg Oral BID  . vitamin C  500 mg Oral Daily  . zinc sulfate  220 mg Oral Daily   Continuous Infusions: . sodium chloride Stopped (07/29/19 0251)  . dextrose  50 mL/hr at 07/30/19 0858  . lactated ringers 50 mL/hr at 07/30/19 0859     LOS: 5 days   Time spent= 35 mins    Shyne Lehrke Joline Maxcy, MD [facility]  If 7PM-7AM, please contact night-coverage  07/30/2019, 12:13 PM

## 2019-07-31 DIAGNOSIS — Z452 Encounter for adjustment and management of vascular access device: Secondary | ICD-10-CM

## 2019-07-31 DIAGNOSIS — A419 Sepsis, unspecified organism: Secondary | ICD-10-CM

## 2019-07-31 DIAGNOSIS — F419 Anxiety disorder, unspecified: Secondary | ICD-10-CM

## 2019-07-31 DIAGNOSIS — R0902 Hypoxemia: Secondary | ICD-10-CM

## 2019-07-31 LAB — CBC
HCT: 32.2 % — ABNORMAL LOW (ref 39.0–52.0)
Hemoglobin: 10.2 g/dL — ABNORMAL LOW (ref 13.0–17.0)
MCH: 30.3 pg (ref 26.0–34.0)
MCHC: 31.7 g/dL (ref 30.0–36.0)
MCV: 95.5 fL (ref 80.0–100.0)
Platelets: 432 10*3/uL — ABNORMAL HIGH (ref 150–400)
RBC: 3.37 MIL/uL — ABNORMAL LOW (ref 4.22–5.81)
RDW: 14.6 % (ref 11.5–15.5)
WBC: 8.3 10*3/uL (ref 4.0–10.5)
nRBC: 0.2 % (ref 0.0–0.2)

## 2019-07-31 LAB — COMPREHENSIVE METABOLIC PANEL
ALT: 88 U/L — ABNORMAL HIGH (ref 0–44)
AST: 52 U/L — ABNORMAL HIGH (ref 15–41)
Albumin: 2.6 g/dL — ABNORMAL LOW (ref 3.5–5.0)
Alkaline Phosphatase: 67 U/L (ref 38–126)
Anion gap: 8 (ref 5–15)
BUN: 32 mg/dL — ABNORMAL HIGH (ref 6–20)
CO2: 20 mmol/L — ABNORMAL LOW (ref 22–32)
Calcium: 8.7 mg/dL — ABNORMAL LOW (ref 8.9–10.3)
Chloride: 119 mmol/L — ABNORMAL HIGH (ref 98–111)
Creatinine, Ser: 1.75 mg/dL — ABNORMAL HIGH (ref 0.61–1.24)
GFR calc Af Amer: 54 mL/min — ABNORMAL LOW (ref >60)
GFR calc non Af Amer: 47 mL/min — ABNORMAL LOW (ref >60)
Glucose, Bld: 229 mg/dL — ABNORMAL HIGH (ref 70–99)
Potassium: 4.6 mmol/L (ref 3.5–5.1)
Sodium: 147 mmol/L — ABNORMAL HIGH (ref 135–145)
Total Bilirubin: 0.3 mg/dL (ref 0.3–1.2)
Total Protein: 6.5 g/dL (ref 6.5–8.1)

## 2019-07-31 LAB — CULTURE, BLOOD (ROUTINE X 2)
Culture: NO GROWTH
Special Requests: ADEQUATE

## 2019-07-31 LAB — LACTATE DEHYDROGENASE: LDH: 311 U/L — ABNORMAL HIGH (ref 98–192)

## 2019-07-31 LAB — GLUCOSE, CAPILLARY
Glucose-Capillary: 232 mg/dL — ABNORMAL HIGH (ref 70–99)
Glucose-Capillary: 421 mg/dL — ABNORMAL HIGH (ref 70–99)

## 2019-07-31 LAB — C-REACTIVE PROTEIN: CRP: 1.1 mg/dL — ABNORMAL HIGH (ref <1.0)

## 2019-07-31 LAB — D-DIMER, QUANTITATIVE: D-Dimer, Quant: 1.38 ug{FEU}/mL — ABNORMAL HIGH (ref 0.00–0.50)

## 2019-07-31 LAB — MAGNESIUM: Magnesium: 2.1 mg/dL (ref 1.7–2.4)

## 2019-07-31 LAB — FERRITIN: Ferritin: 306 ng/mL (ref 24–336)

## 2019-07-31 LAB — LITHIUM LEVEL: Lithium Lvl: 0.34 mmol/L — ABNORMAL LOW (ref 0.60–1.20)

## 2019-07-31 NOTE — Care Management Important Message (Signed)
Important Message  Patient Details  Name: Casey Fernandez MRN: [id number] Date of Birth: 09/16/1975   Medicare Important Message Given:  Yes - Important Message mailed due to current National Emergency  Verbal consent obtained due to current National Emergency  Relationship to patient: Aunt/Uncle Contact Name: Leandra Kern Call Date: 07/31/19  Time: 1347 Phone: [phone] Outcome: Spoke with contact Important Message mailed to: Emergency contact on file    Delorse Lek 07/31/2019, 1:47 PM

## 2019-07-31 NOTE — Discharge Summary (Signed)
Physician Discharge Summary  HELMUT HENNON ZOX:[id number] DOB: Nov 09, 1975 DOA: 07/25/2019  PCP: [facility]  Admit date: 07/25/2019 Discharge date: 07/31/2019  Admitted From: Home  Disposition:  Home   Recommendations for Outpatient Follow-up and new medication changes:  1. Follow up with [facility] in 14 days.  2. Follow up renal function in 7 days.   Home Health: no  Equipment/Devices: no    Discharge Condition: stable  CODE STATUS: full  Diet recommendation: regular.   Brief/Interim Summary: 43 year old male who presented with cough, fever and dyspnea.  He does have significant past medical history of hypertension, dyslipidemia, diabetes mellitus type 2, morbid obesity, sleep apnea, anxiety, depression, and he is nonverbal due to autism.  Patient tested positive for COVID-19 July 19, 2019, at home patient had persistent high-grade fevers, cough, dyspnea, generalized weakness and decreased appetite.  On his initial physical examination he was febrile 101.2, tachycardic 113 bpm, tachypneic 30 to 34 breaths/min, blood pressure 125/78, oxygen saturation 92%.,  His lungs are clear to auscultation bilaterally, heart S1-S2 present rhythmic, the abdomen protuberant, no lower extremity edema. Sodium 130, potassium 4.0, chloride 102, bicarb 18, glucose 199, BUN 45, creatinine 3.16, white count 5.3, hemoglobin 13.6, hematocrit 42.8, platelets 194.  SARS COVID-19 was positive.  Urinalysis negative for infection.  Chest radiograph had a left lower lobe infiltrate.  Patient was admitted to the hospital with the working diagnosis of acute hypoxic respiratory failure due to SARS COVID-19 viral pneumonia.  Patient respiratory failure rapidly declined and he required invasive mechanical ventilation November 12.  Patient was treated with remdesivir, corticosteroids and antibiotics (ceftriaxone/azithromycin).  For severe acidosis he received bicarb infusion.  Patient was liberated from  mechanical ventilation November 15.  His hospitalization was prolonged due to abnormal electrolytes, hypernatremia and hyperchloremia  1.  Acute hypoxic respiratory failure due to SARS COVID-19 viral pneumonia.  Patient had rapid decline in his respiratory distress, he required mechanical ventilation, follow ARDS net protocol.  Patient was treated with systemic steroids and remdesivir.  Due to suspicion of bacterial over infection he received azithromycin and ceftriaxone for 5 doses.  His respiratory failure improved and he was liberated from mechanical ventilation on November 15.  His oximetry on room air at discharge is 97%.  Physical therapy recommended no follow-up.  2.  Acute kidney injury with nongap metabolic acidosis/hyperchloremic.  Patient received supportive intravenous fluids including bicarb drip, his kidney function improved, discharge creatinine 1.75, sodium 147, chloride 119, bicarb 20.  Recommend follow-up kidney function as an outpatient.  3.  Type 2 diabetes mellitus with steroid-induced hyperglycemia.  Patient had severe hyperglycemia, he required IV insulin that was treated was transitioned to subcutaneous. At discharge will resume metformin.   4.  Acute metabolic encephalopathy in the setting of autism, anxiety and depression.  Patient received supportive medical therapy with improvement of his symptoms.  Patient will continue hydroxyzine, lithium, quetiapine, clonazepam and topiramate. Initial lithium was mildly elevated 1.35, at discharge down to 0.34.   5.  Hypertension with dyslipidemia.  Patient will continue blood pressure control with metoprolol, continue aspirin and atorvastatin. To resume losartan at discharge.   6.  Anemia chronic disease.  Hemoglobin and hematocrit remained stable.  Discharge Diagnoses:  Principal Problem:   Acute respiratory failure with hypoxia (HCC) Active Problems:   Pneumonia due to COVID-19 virus   AKI (acute kidney injury) (HCC)    Essential hypertension   DM (diabetes mellitus) (HCC)   HLD (hyperlipidemia)   Sleep apnea  Autism   Anxiety   COVID-19   Hypernatremia    Discharge Instructions   Allergies as of 07/31/2019   No Known Allergies     Medication List    TAKE these medications   acetaminophen 325 MG tablet Commonly known as: TYLENOL Take 650 mg by mouth every 6 (six) hours as needed for mild pain or fever.   aspirin EC 81 MG tablet Take 81 mg by mouth daily.   atorvastatin 20 MG tablet Commonly known as: LIPITOR Take 20 mg by mouth at bedtime.   clonazePAM 1 MG tablet Commonly known as: KLONOPIN Take 1 mg by mouth 3 (three) times daily. 0800, 1200, 1900   hydrOXYzine 10 MG tablet Commonly known as: ATARAX/VISTARIL Take 10 mg by mouth 2 (two) times daily.   lithium carbonate 300 MG CR tablet Commonly known as: LITHOBID Take 300-600 mg by mouth See admin instructions. Take  in the morning and  at bedtime.   losartan 25 MG tablet Commonly known as: COZAAR Take 25 mg by mouth daily.   metFORMIN 500 MG tablet Commonly known as: GLUCOPHAGE Take 500 mg by mouth 2 (two) times daily.   metoprolol 200 MG 24 hr tablet Commonly known as: TOPROL-XL Take 200 mg by mouth daily.   polyethylene glycol powder 17 GM/SCOOP powder Commonly known as: GLYCOLAX/MIRALAX Take 17 g by mouth daily.   QUEtiapine 300 MG 24 hr tablet Commonly known as: SEROQUEL XR Take 300 mg by mouth 2 (two) times daily.   topiramate 100 MG tablet Commonly known as: TOPAMAX Take 100 mg by mouth 2 (two) times daily.       No Known Allergies  Consultations:     Procedures/Studies: Dg Chest Port 1 View  Result Date: 07/28/2019 CLINICAL DATA:  COVID positive. Status post intubation. EXAM: PORTABLE CHEST 1 VIEW COMPARISON:  07/27/2019 FINDINGS: The endotracheal tube tip is approximately 5.4 cm above the carina. There is a right IJ catheter with tip at the cavoatrial junction. NG tube tip is  below the GE junction. The side port for the NG tube is at or just below the GE junction. Unchanged cardiac enlargement. Asymmetric hazy opacities throughout the left lung and right base appear unchanged from previous exam. IMPRESSION: 1. Stable support apparatus. 2. No change in aeration to the lungs compared with previous exam. 3. The side port of the nasogastric tube is at or just below the GE junction. Electronically Signed   By: Signa Kell M.D.   On: 07/28/2019 08:14   Dg Chest Port 1 View  Result Date: 07/27/2019 CLINICAL DATA:  COVID positive. Status post ET tube insertion. EXAM: PORTABLE CHEST 1 VIEW COMPARISON:  07/26/2019 FINDINGS: The ET tube tip is above the carina. NG tube tip is below the level of the GE junction. Right IJ catheter tip projects over the distal SVC. Bilateral low lung volumes. Diffuse hazy lung opacities throughout the left lung and right base are noted compatible with COVID 19 positivity. There is been mild improved aeration to the left midlung and left base. IMPRESSION: 1. Satisfactory position of support apparatus. 2. Persistent bilateral lung hazy opacities . 3. Improved aeration to the left midlung and left base. Electronically Signed   By: Signa Kell M.D.   On: 07/27/2019 06:28   Dg Chest Port 1 View  Result Date: 07/26/2019 CLINICAL DATA:  Check central line placement EXAM: PORTABLE CHEST 1 VIEW COMPARISON:  07/25/2019 FINDINGS: Endotracheal tube is noted in satisfactory position. Gastric catheter is noted within  the stomach. New right jugular central line is noted at cavoatrial junction. No pneumothorax is seen. Focal infiltrate is noted in the left base slightly increased from the prior exam. Patchy atelectatic changes are noted in the right base. IMPRESSION: No pneumothorax following central line placement. Gastric catheter and endotracheal tube in satisfactory position. Increasing left basilar consolidation with mild right basilar atelectasis. Electronically  Signed   By: Alcide CleverMark  Lukens M.D.   On: 07/26/2019 03:14   Dg Chest Port 1 View  Result Date: 07/26/2019 CLINICAL DATA:  Post ETT EXAM: PORTABLE CHEST 1 VIEW COMPARISON:  July 25, 2019 FINDINGS: The heart size and mediastinal contours are unchanged. The tip of the ET tube appears to be just at the edge of the radiograph and is 4.5 cm above the level of the carina. Again noted is hazy/patchy airspace opacities throughout the left lung. IMPRESSION: Tip of the ET tube is seen at the superior edge of the radiograph 4.5 cm above the carina, and could be advanced. Hazy/patchy airspace opacity throughout the left lung, concerning for pneumonia. Electronically Signed   By: Jonna ClarkBindu  Avutu M.D.   On: 07/26/2019 00:08   Dg Chest Port 1 View  Result Date: 07/25/2019 CLINICAL DATA:  Pt BIB [facility] from group home ([facility]) for known COVID+ test (07/19/19) w/ fever, chills, diarrhea, and cough for 2 days. Pt is nonverbal, best obtainable images d/t pt condition EXAM: PORTABLE CHEST 1 VIEW COMPARISON:  None. FINDINGS: Cardiac silhouette is partly obscured, but appears at least top normal in size to mildly enlarged. No mediastinal or hilar masses. There is hazy opacity at the left lung base. A more subtle [place] opacity is suggested at the lateral right lung base. Remainder of the lungs is clear. No convincing pleural effusion.  No pneumothorax. Skeletal structures are grossly intact. IMPRESSION: 1. Hazy left lung base opacity which is concerning for pneumonia. There may be additional infiltrate at the right lateral lung base. Electronically Signed   By: Amie Portlandavid  Ormond M.D.   On: 07/25/2019 16:39   Dg Abd Portable 1 View  Result Date: 07/25/2019 CLINICAL DATA:  From group home with known COVID positive test. Fever, chills, diarrhea and cough for 2 days. EXAM: PORTABLE ABDOMEN - 1 VIEW COMPARISON:  None. FINDINGS: Normal bowel gas pattern. Soft tissues are poorly defined due to the patient's body habitus. No convincing  renal or ureteral stones. Soft skeletal structures are unremarkable. IMPRESSION: Negative. Electronically Signed   By: Amie Portlandavid  Ormond M.D.   On: 07/25/2019 16:40      Procedures:   Subjective: Patient is feeling well, no dyspnea, no chest pain, no nausea or vomiting.   Discharge Exam: Vitals:   07/31/19 0400 07/31/19 0700  BP: 127/69 (!) 146/82  Pulse: 60 89  Resp:  18  Temp: 98 F (36.7 C) (!) 97.1 F (36.2 C)  SpO2: 91% 97%   Vitals:   07/30/19 2000 07/31/19 0400 07/31/19 0500 07/31/19 0700  BP:  127/69  (!) 146/82  Pulse: 90 60  89  Resp:    18  Temp:  98 F (36.7 C)  (!) 97.1 F (36.2 C)  TempSrc:  Oral  Oral  SpO2: 96% 91%  97%  Weight:   116.2 kg   Height:        General: Not in pain or dyspnea  Neurology: Awake and alert, non focal  E ENT: no pallor, no icterus, oral mucosa moist Cardiovascular: No JVD. S1-S2 present, rhythmic, no gallops, rubs, or murmurs.  No lower extremity edema. Pulmonary: posiitve breath sounds bilaterally. Gastrointestinal. Abdomen flat, no organomegaly, non tender, no rebound or guarding Skin. No rashes Musculoskeletal: no joint deformities   The results of significant diagnostics from this hospitalization (including imaging, microbiology, ancillary and laboratory) are listed below for reference.     Microbiology: Recent Results (from the past 240 hour(s))  Blood Culture (routine x 2)     Status: None   Collection Time: 07/25/19  3:42 PM   Specimen: BLOOD  Result Value Ref Range Status   Specimen Description BLOOD RIGHT ANTECUBITAL  Final   Special Requests   Final    BOTTLES DRAWN AEROBIC AND ANAEROBIC Blood Culture adequate volume   Culture   Final    NO GROWTH 5 DAYS Performed at [facility], [address], [place], [place] [address]    Report Status 07/30/2019 FINAL  Final  SARS CORONAVIRUS 2 (TAT 6-24 HRS) Nasopharyngeal Nasopharyngeal Swab     Status: Abnormal   Collection Time: 07/25/19  4:25 PM   Specimen:  Nasopharyngeal Swab  Result Value Ref Range Status   SARS Coronavirus 2 POSITIVE (A) NEGATIVE Final    Comment: RESULT CALLED TO, READ BACK BY AND VERIFIED WITH: RN KRISTINA MNUETT AT 0008 BY MESSAN HOUEGNIFION ON 07/26/2019                                              (NOTE) SARS-CoV-2 target nucleic acids are DETECTED. The SARS-CoV-2 RNA is generally detectable in upper and lower respiratory specimens during the acute phase of infection. Positive results are indicative of active infection with SARS-CoV-2. Clinical  correlation with patient history and other diagnostic information is necessary to determine patient infection status. Positive results do  not rule out bacterial infection or co-infection with other viruses. The expected result is Negative. Fact Sheet for Patients: HairSlick.no Fact Sheet for Healthcare Providers: quierodirigir.com This test is not yet approved or cleared by the [place] FDA and  has been authorized for detection and/or diagnosis of SARS-CoV-2 by FDA under an Emergency Use Authorization (EUA) . This EUA will remain  in effect (meaning this test can be used) for the duration of the COVID-19 declaration under Section 564(b)(1) of the Act, 21 U.S.C. section 360bbb-3(b)(1), unless the authorization is terminated or revoked sooner. Performed at [facility], [address] [place], [place] [address]   Urine culture     Status: None   Collection Time: 07/26/19 12:44 AM   Specimen: Urine, Random  Result Value Ref Range Status   Specimen Description URINE, RANDOM  Final   Special Requests NONE  Final   Culture   Final    NO GROWTH Performed at [facility], [address] [place], [place] [address]    Report Status 07/27/2019 FINAL  Final  Blood Culture (routine x 2)     Status: None (Preliminary result)   Collection Time: 07/26/19 10:15 AM   Specimen: BLOOD  Result Value Ref Range  Status   Specimen Description   Final    BLOOD RIGHT HAND Performed at [facility], [address] [place], [place] [address]    Special Requests   Final    BOTTLES DRAWN AEROBIC ONLY Blood Culture adequate volume Performed at [facility], [address] [place], [place] [address]  Culture   Final    NO GROWTH 4 DAYS Performed at [facility], [address], [place], [place] [address]    Report Status PENDING  Incomplete  MRSA PCR Screening     Status: None   Collection Time: 07/26/19 10:15 AM   Specimen: Nasal Mucosa; Nasopharyngeal  Result Value Ref Range Status   MRSA by PCR NEGATIVE NEGATIVE Final    Comment:        The GeneXpert MRSA Assay (FDA approved for NASAL specimens only), is one component of a comprehensive MRSA colonization surveillance program. It is not intended to diagnose MRSA infection nor to guide or monitor treatment for MRSA infections. Performed at [facility], [address], [place], [place] [address]      Labs: BNP (last 3 results) Recent Labs    07/25/19 2005  BNP 20.0   Basic Metabolic Panel: Recent Labs  Lab 07/26/19 1630  07/27/19 0445 07/27/19 1723  07/28/19 0445 07/29/19 0632 07/30/19 1520 07/30/19 2245 07/31/19 0450  NA  --    < > 146*  --    < > 147* 154* 144 144 147*  K  --    < > 4.1  --    < > 4.2 3.8 4.6 4.3 4.6  CL  --   --  123*  --    < > 123* 128* 116* 119* 119*  CO2  --   --  16*  --    < > 18* 19* 20* 20* 20*  GLUCOSE  --   --  300*  --    < > 300* 204* 423* 351* 229*  BUN  --   --  46*  --    < > 52* 43* 38* 36* 32*  CREATININE  --   --  2.27*  --    < > 2.32* 2.08* 1.94* 1.92* 1.75*  CALCIUM  --   --  8.6*  --    < > 8.6* 8.5* 8.6* 8.3* 8.7*  MG 2.2  --  2.3 2.4  --   --   --   --   --  2.1  PHOS 1.1*  --  1.8* 2.6  --   --   --   --   --   --    < > = values in this interval not displayed.   Liver Function Tests: Recent Labs  Lab  07/25/19 1504 07/27/19 0445 07/31/19 0450  AST 50* 54* 52*  ALT 48* 52* 88*  ALKPHOS 77 71 67  BILITOT 0.7 <0.1* 0.3  PROT 7.6 6.5 6.5  ALBUMIN 3.2* 2.4* 2.6*   No results for input(s): LIPASE, AMYLASE in the last 168 hours. No results for input(s): AMMONIA in the last 168 hours. CBC: Recent Labs  Lab 07/25/19 1504  07/27/19 0445 07/28/19 0443 07/28/19 0445 07/29/19 0632 07/31/19 0450  WBC 5.3  --  10.2  --  9.0 10.1 8.3  NEUTROABS 4.2  --  8.9*  --   --   --   --   HGB 13.6   < > 9.7* 9.9* 9.1* 9.8* 10.2*  HCT 42.8   < > 29.8* 29.0* 27.9* 30.3* 32.2*  MCV 94.9  --  93.7  --  94.3 94.4 95.5  PLT 194  --  291  --  327 381 432*   < > = values in this interval not displayed.   Cardiac Enzymes: No results for input(s): CKTOTAL, CKMB, CKMBINDEX, TROPONINI  in the last 168 hours. BNP: Invalid input(s): POCBNP CBG: Recent Labs  Lab 07/30/19 0749 07/30/19 1114 07/30/19 1655 07/30/19 2034 07/31/19 0724  GLUCAP 209* 310* 398* 372* 232*   D-Dimer Recent Labs    07/31/19 0450  DDIMER 1.38*   Hgb A1c No results for input(s): HGBA1C in the last 72 hours. Lipid Profile No results for input(s): CHOL, HDL, LDLCALC, TRIG, CHOLHDL, LDLDIRECT in the last 72 hours. Thyroid function studies No results for input(s): TSH, T4TOTAL, T3FREE, THYROIDAB in the last 72 hours.  Invalid input(s): FREET3 Anemia work up Recent Labs    07/31/19 0450  FERRITIN 306   Urinalysis    Component Value Date/Time   COLORURINE STRAW (A) 07/26/2019 0051   APPEARANCEUR CLEAR 07/26/2019 0051   LABSPEC 1.006 07/26/2019 0051   PHURINE 6.0 07/26/2019 0051   GLUCOSEU NEGATIVE 07/26/2019 0051   HGBUR MODERATE (A) 07/26/2019 0051   BILIRUBINUR NEGATIVE 07/26/2019 0051   KETONESUR NEGATIVE 07/26/2019 0051   PROTEINUR NEGATIVE 07/26/2019 0051   NITRITE NEGATIVE 07/26/2019 0051   LEUKOCYTESUR NEGATIVE 07/26/2019 0051   Sepsis Labs Invalid input(s): PROCALCITONIN,  WBC,   LACTICIDVEN Microbiology Recent Results (from the past 240 hour(s))  Blood Culture (routine x 2)     Status: None   Collection Time: 07/25/19  3:42 PM   Specimen: BLOOD  Result Value Ref Range Status   Specimen Description BLOOD RIGHT ANTECUBITAL  Final   Special Requests   Final    BOTTLES DRAWN AEROBIC AND ANAEROBIC Blood Culture adequate volume   Culture   Final    NO GROWTH 5 DAYS Performed at [facility], [address] [place], [place] [address]    Report Status 07/30/2019 FINAL  Final  SARS CORONAVIRUS 2 (TAT 6-24 HRS) Nasopharyngeal Nasopharyngeal Swab     Status: Abnormal   Collection Time: 07/25/19  4:25 PM   Specimen: Nasopharyngeal Swab  Result Value Ref Range Status   SARS Coronavirus 2 POSITIVE (A) NEGATIVE Final    Comment: RESULT CALLED TO, READ BACK BY AND VERIFIED WITH: RN KRISTINA MNUETT AT 0008 BY MESSAN HOUEGNIFION ON 07/26/2019                                              (NOTE) SARS-CoV-2 target nucleic acids are DETECTED. The SARS-CoV-2 RNA is generally detectable in upper and lower respiratory specimens during the acute phase of infection. Positive results are indicative of active infection with SARS-CoV-2. Clinical  correlation with patient history and other diagnostic information is necessary to determine patient infection status. Positive results do  not rule out bacterial infection or co-infection with other viruses. The expected result is Negative. Fact Sheet for Patients: HairSlick.no Fact Sheet for Healthcare Providers: quierodirigir.com This test is not yet approved or cleared by the [place] FDA and  has been authorized for detection and/or diagnosis of SARS-CoV-2 by FDA under an Emergency Use Authorization (EUA) . This EUA will remain  in effect (meaning this test can be used) for the duration of the COVID-19 declaration under Section 564(b)(1) of the Act, 21 U.S.C. section  360bbb-3(b)(1), unless the authorization is terminated or revoked sooner. Performed at [facility], [address] [place], [place] [address]   Urine culture     Status: None   Collection Time: 07/26/19 12:44 AM   Specimen: Urine, Random  Result  Value Ref Range Status   Specimen Description URINE, RANDOM  Final   Special Requests NONE  Final   Culture   Final    NO GROWTH Performed at [facility], [address], [place], [place] [address]    Report Status 07/27/2019 FINAL  Final  Blood Culture (routine x 2)     Status: None (Preliminary result)   Collection Time: 07/26/19 10:15 AM   Specimen: BLOOD  Result Value Ref Range Status   Specimen Description   Final    BLOOD RIGHT HAND Performed at [place] [address], [place], [place] [address]    Special Requests   Final    BOTTLES DRAWN AEROBIC ONLY Blood Culture adequate volume Performed at [place] [address] [place], [place] [address]    Culture   Final    NO GROWTH 4 DAYS Performed at [facility], [place] [address], [place], [place] [address]    Report Status PENDING  Incomplete  MRSA PCR Screening     Status: None   Collection Time: 07/26/19 10:15 AM   Specimen: Nasal Mucosa; Nasopharyngeal  Result Value Ref Range Status   MRSA by PCR NEGATIVE NEGATIVE Final    Comment:        The GeneXpert MRSA Assay (FDA approved for NASAL specimens only), is one component of a comprehensive MRSA colonization surveillance program. It is not intended to diagnose MRSA infection nor to guide or monitor treatment for MRSA infections. Performed at [facility], [place] [address] [place], [place] [address]      Time coordinating discharge: 45 minutes  SIGNED:   Tawni Millers, MD  [facility] 07/31/2019, 11:01 AM

## 2019-07-31 NOTE — Plan of Care (Signed)
  Problem: Respiratory: Goal: Will maintain a patent airway Outcome: Progressing Goal: Complications related to the disease process, condition or treatment will be avoided or minimized Outcome: Progressing   Problem: Education: Goal: Knowledge of risk factors and measures for prevention of condition will improve Outcome: Progressing   Problem: Coping: Goal: Psychosocial and spiritual needs will be supported Outcome: Progressing   Problem: Clinical Measurements: Goal: Will remain free from infection Outcome: Progressing Goal: Respiratory complications will improve Outcome: Progressing   Problem: Activity: Goal: Risk for activity intolerance will decrease Outcome: Progressing   Problem: Pain Managment: Goal: General experience of comfort will improve Outcome: Progressing   Problem: Safety: Goal: Ability to remain free from injury will improve Outcome: Progressing   Problem: Education: Goal: Knowledge of General Education information will improve Description: Including pain rating scale, medication(s)/side effects and non-pharmacologic comfort measures Outcome: Progressing

## 2019-07-31 NOTE — TOC Transition Note (Signed)
Transition of Care [facility]) - CM/SW Discharge Note   Patient Details  Name: KOSISOCHUKWU BURNINGHAM MRN: [id number] Date of Birth: Jun 24, 1976  Transition of [facility] CM/SW Contact:  Amador Cunas, LCSW Phone Number: 07/31/2019, 12:41 PM   Clinical Narrative:   Pt for d/c back to [facility] group home. Spoke to Ms. Rose at group home [phone]  who confirmed pt able to return to private room with plan to quarantine and only requested paperwork is d/c summary be sent with pt. No need for RN report per Ms. Kalman Shan. [facility] arranged for 2pm transport. Notified pt's uncle, Nicole Kindred [phone], of d/c and he reports agreeable. SW signing off at d/c.   Wandra Feinstein, MSW, LCSW [phone] (GV coverage)       Final next level of care: Group Home Barriers to Discharge: No Barriers Identified   Patient Goals and CMS Choice Patient states their goals for this hospitalization and ongoing recovery are:: Nicole Kindred -uncle- to go back to his group home      Discharge Placement              Patient chooses bed at: (Return to [facility]) Patient to be transferred to facility by: [place] Name of family member notified: Nicole Kindred Patient and family notified of of transfer: 07/31/19  Discharge Plan and Services   Discharge Planning Services: CM Consult                                 Social Determinants of Health (SDOH) Interventions     Readmission Risk Interventions No flowsheet data found.

## 2019-07-31 NOTE — Progress Notes (Signed)
Inpatient Diabetes Program Recommendations  AACE/ADA: New Consensus Statement on Inpatient Glycemic Control (2015)  Target Ranges:  Prepandial:   less than 140 mg/dL      Peak postprandial:   less than 180 mg/dL (1-2 hours)      Critically ill patients:  140 - 180 mg/dL   Lab Results  Component Value Date   GLUCAP 232 (H) 07/31/2019   HGBA1C 7.5 (H) 07/25/2019    Review of Glycemic Control Results for TRELLIS, VANOVERBEKE (MRN [id number]) as of 07/31/2019 09:40  Ref. Range 07/30/2019 07:49 07/30/2019 11:14 07/30/2019 16:55 07/30/2019 20:34 07/31/2019 07:24  Glucose-Capillary Latest Ref Range: 70 - 99 mg/dL 209 (H) 310 (H) 398 (H) 372 (H) 232 (H)   Inpatient Diabetes Program Recommendations:    If steroids continued as ordered, please consider: -Increase Novolog meal coverage to 6 units tid if eats 50% -Increase Novolog correction to moderate + hs 0-5  Thank you, Bethena Roys E. Lianny Molter, RN, MSN, CDE  Diabetes Coordinator Inpatient Glycemic Control Team Team Pager [phone] (8am-5pm) 07/31/2019 9:44 AM

## 2019-07-31 NOTE — Progress Notes (Signed)
CRITICAL VALUE ALERT  Critical Value:  Blood sugar 421  Date & Time Notied:  11/18 @ 1233  Provider Notified: Dr Cathlean Sauer  Orders Received/Actions taken: dose with SSI insulin, does not want lab confirmed value.  Pt to be d/ced today.

## 2019-12-23 ENCOUNTER — Ambulatory Visit: Payer: Medicare Other

## 2021-07-16 ENCOUNTER — Other Ambulatory Visit (HOSPITAL_COMMUNITY)
Admission: RE | Admit: 2021-07-16 | Discharge: 2021-07-16 | Disposition: A | Discharge status: Home / Self Care | Payer: Medicare Other | Source: Other Acute Inpatient Hospital

## 2021-10-05 ENCOUNTER — Emergency Department (HOSPITAL_COMMUNITY): Payer: Medicare Other

## 2021-10-05 ENCOUNTER — Other Ambulatory Visit: Payer: Self-pay

## 2021-10-05 ENCOUNTER — Encounter (HOSPITAL_COMMUNITY): Payer: Self-pay | Admitting: Emergency Medicine

## 2021-10-05 ENCOUNTER — Inpatient Hospital Stay (HOSPITAL_COMMUNITY)
Admission: EM | Admit: 2021-10-05 | Discharge: 2021-10-08 | DRG: 683 | Disposition: A | Discharge status: Home / Self Care | Payer: Medicare Other | Attending: Internal Medicine | Admitting: Internal Medicine

## 2021-10-05 DIAGNOSIS — Z7984 Long term (current) use of oral hypoglycemic drugs: Secondary | ICD-10-CM | POA: Diagnosis not present

## 2021-10-05 DIAGNOSIS — N1832 Chronic kidney disease, stage 3b: Secondary | ICD-10-CM | POA: Diagnosis present

## 2021-10-05 DIAGNOSIS — F419 Anxiety disorder, unspecified: Secondary | ICD-10-CM | POA: Diagnosis present

## 2021-10-05 DIAGNOSIS — D631 Anemia in chronic kidney disease: Secondary | ICD-10-CM | POA: Diagnosis present

## 2021-10-05 DIAGNOSIS — E1122 Type 2 diabetes mellitus with diabetic chronic kidney disease: Secondary | ICD-10-CM | POA: Diagnosis present

## 2021-10-05 DIAGNOSIS — F32A Depression, unspecified: Secondary | ICD-10-CM | POA: Diagnosis present

## 2021-10-05 DIAGNOSIS — E785 Hyperlipidemia, unspecified: Secondary | ICD-10-CM | POA: Diagnosis present

## 2021-10-05 DIAGNOSIS — Z79899 Other long term (current) drug therapy: Secondary | ICD-10-CM | POA: Diagnosis not present

## 2021-10-05 DIAGNOSIS — E669 Obesity, unspecified: Secondary | ICD-10-CM | POA: Diagnosis present

## 2021-10-05 DIAGNOSIS — Z6831 Body mass index (BMI) 31.0-31.9, adult: Secondary | ICD-10-CM

## 2021-10-05 DIAGNOSIS — B349 Viral infection, unspecified: Secondary | ICD-10-CM | POA: Diagnosis present

## 2021-10-05 DIAGNOSIS — R451 Restlessness and agitation: Secondary | ICD-10-CM | POA: Diagnosis not present

## 2021-10-05 DIAGNOSIS — E119 Type 2 diabetes mellitus without complications: Secondary | ICD-10-CM

## 2021-10-05 DIAGNOSIS — I129 Hypertensive chronic kidney disease with stage 1 through stage 4 chronic kidney disease, or unspecified chronic kidney disease: Secondary | ICD-10-CM | POA: Diagnosis present

## 2021-10-05 DIAGNOSIS — Z7982 Long term (current) use of aspirin: Secondary | ICD-10-CM

## 2021-10-05 DIAGNOSIS — F840 Autistic disorder: Secondary | ICD-10-CM | POA: Diagnosis present

## 2021-10-05 DIAGNOSIS — Z20822 Contact with and (suspected) exposure to covid-19: Secondary | ICD-10-CM | POA: Diagnosis present

## 2021-10-05 DIAGNOSIS — N179 Acute kidney failure, unspecified: Principal | ICD-10-CM | POA: Diagnosis present

## 2021-10-05 DIAGNOSIS — I10 Essential (primary) hypertension: Secondary | ICD-10-CM | POA: Diagnosis present

## 2021-10-05 DIAGNOSIS — R4182 Altered mental status, unspecified: Secondary | ICD-10-CM | POA: Diagnosis present

## 2021-10-05 LAB — CREATININE, SERUM
Creatinine, Ser: 2.84 mg/dL — ABNORMAL HIGH (ref 0.61–1.24)
GFR, Estimated: 27 mL/min — ABNORMAL LOW (ref >60)

## 2021-10-05 LAB — I-STAT CHEM 8, ED
BUN: 29 mg/dL — ABNORMAL HIGH (ref 6–20)
Calcium, Ion: 1.26 mmol/L (ref 1.15–1.40)
Chloride: 116 mmol/L — ABNORMAL HIGH (ref 98–111)
Creatinine, Ser: 3 mg/dL — ABNORMAL HIGH (ref 0.61–1.24)
Glucose, Bld: 79 mg/dL (ref 70–99)
HCT: 30 % — ABNORMAL LOW (ref 39.0–52.0)
Hemoglobin: 10.2 g/dL — ABNORMAL LOW (ref 13.0–17.0)
Potassium: 3.7 mmol/L (ref 3.5–5.1)
Sodium: 146 mmol/L — ABNORMAL HIGH (ref 135–145)
TCO2: 19 mmol/L — ABNORMAL LOW (ref 22–32)

## 2021-10-05 LAB — I-STAT VENOUS BLOOD GAS, ED
Acid-base deficit: 6 mmol/L — ABNORMAL HIGH (ref 0.0–2.0)
Bicarbonate: 17.6 mmol/L — ABNORMAL LOW (ref 20.0–28.0)
Calcium, Ion: 1.14 mmol/L — ABNORMAL LOW (ref 1.15–1.40)
HCT: 30 % — ABNORMAL LOW (ref 39.0–52.0)
Hemoglobin: 10.2 g/dL — ABNORMAL LOW (ref 13.0–17.0)
O2 Saturation: 99 %
Potassium: 3.7 mmol/L (ref 3.5–5.1)
Sodium: 145 mmol/L (ref 135–145)
TCO2: 18 mmol/L — ABNORMAL LOW (ref 22–32)
pCO2, Ven: 27 mmHg — ABNORMAL LOW (ref 44.0–60.0)
pH, Ven: 7.42 (ref 7.250–7.430)
pO2, Ven: 129 mmHg — ABNORMAL HIGH (ref 32.0–45.0)

## 2021-10-05 LAB — URINALYSIS, ROUTINE W REFLEX MICROSCOPIC
Bilirubin Urine: NEGATIVE
Glucose, UA: NEGATIVE mg/dL
Hgb urine dipstick: NEGATIVE
Ketones, ur: NEGATIVE mg/dL
Leukocytes,Ua: NEGATIVE
Nitrite: NEGATIVE
Protein, ur: NEGATIVE mg/dL
Specific Gravity, Urine: 1.006 (ref 1.005–1.030)
pH: 7 (ref 5.0–8.0)

## 2021-10-05 LAB — CBC WITH DIFFERENTIAL/PLATELET
Abs Immature Granulocytes: 0 10*3/uL (ref 0.00–0.07)
Basophils Absolute: 0 10*3/uL (ref 0.0–0.1)
Basophils Relative: 1 %
Eosinophils Absolute: 0.1 10*3/uL (ref 0.0–0.5)
Eosinophils Relative: 3 %
HCT: 33.2 % — ABNORMAL LOW (ref 39.0–52.0)
Hemoglobin: 10.2 g/dL — ABNORMAL LOW (ref 13.0–17.0)
Immature Granulocytes: 0 %
Lymphocytes Relative: 28 %
Lymphs Abs: 1.2 10*3/uL (ref 0.7–4.0)
MCH: 29.6 pg (ref 26.0–34.0)
MCHC: 30.7 g/dL (ref 30.0–36.0)
MCV: 96.2 fL (ref 80.0–100.0)
Monocytes Absolute: 0.5 10*3/uL (ref 0.1–1.0)
Monocytes Relative: 12 %
Neutro Abs: 2.4 10*3/uL (ref 1.7–7.7)
Neutrophils Relative %: 56 %
Platelets: 190 10*3/uL (ref 150–400)
RBC: 3.45 MIL/uL — ABNORMAL LOW (ref 4.22–5.81)
RDW: 13.6 % (ref 11.5–15.5)
WBC: 4.3 10*3/uL (ref 4.0–10.5)
nRBC: 0 % (ref 0.0–0.2)

## 2021-10-05 LAB — CBC
HCT: 35.9 % — ABNORMAL LOW (ref 39.0–52.0)
Hemoglobin: 11.4 g/dL — ABNORMAL LOW (ref 13.0–17.0)
MCH: 30.5 pg (ref 26.0–34.0)
MCHC: 31.8 g/dL (ref 30.0–36.0)
MCV: 96 fL (ref 80.0–100.0)
Platelets: 137 10*3/uL — ABNORMAL LOW (ref 150–400)
RBC: 3.74 MIL/uL — ABNORMAL LOW (ref 4.22–5.81)
RDW: 13.7 % (ref 11.5–15.5)
WBC: 4.5 10*3/uL (ref 4.0–10.5)
nRBC: 0 % (ref 0.0–0.2)

## 2021-10-05 LAB — RESP PANEL BY RT-PCR (FLU A&B, COVID) ARPGX2
Influenza A by PCR: NEGATIVE
Influenza B by PCR: NEGATIVE
SARS Coronavirus 2 by RT PCR: NEGATIVE

## 2021-10-05 LAB — COMPREHENSIVE METABOLIC PANEL
ALT: 27 U/L (ref 0–44)
AST: 36 U/L (ref 15–41)
Albumin: 3.3 g/dL — ABNORMAL LOW (ref 3.5–5.0)
Alkaline Phosphatase: 68 U/L (ref 38–126)
Anion gap: 9 (ref 5–15)
BUN: 32 mg/dL — ABNORMAL HIGH (ref 6–20)
CO2: 18 mmol/L — ABNORMAL LOW (ref 22–32)
Calcium: 8.7 mg/dL — ABNORMAL LOW (ref 8.9–10.3)
Chloride: 115 mmol/L — ABNORMAL HIGH (ref 98–111)
Creatinine, Ser: 3.01 mg/dL — ABNORMAL HIGH (ref 0.61–1.24)
GFR, Estimated: 25 mL/min — ABNORMAL LOW (ref >60)
Glucose, Bld: 95 mg/dL (ref 70–99)
Potassium: 3.7 mmol/L (ref 3.5–5.1)
Sodium: 142 mmol/L (ref 135–145)
Total Bilirubin: 0.5 mg/dL (ref 0.3–1.2)
Total Protein: 6.5 g/dL (ref 6.5–8.1)

## 2021-10-05 LAB — RAPID URINE DRUG SCREEN, HOSP PERFORMED
Amphetamines: NOT DETECTED
Barbiturates: NOT DETECTED
Benzodiazepines: NOT DETECTED
Cocaine: NOT DETECTED
Opiates: NOT DETECTED
Tetrahydrocannabinol: NOT DETECTED

## 2021-10-05 LAB — ETHANOL: Alcohol, Ethyl (B): <10 mg/dL (ref <10)

## 2021-10-05 LAB — CBG MONITORING, ED
Glucose-Capillary: 147 mg/dL — ABNORMAL HIGH (ref 70–99)
Glucose-Capillary: 71 mg/dL (ref 70–99)

## 2021-10-05 LAB — HIV ANTIBODY (ROUTINE TESTING W REFLEX): HIV Screen 4th Generation wRfx: NONREACTIVE

## 2021-10-05 LAB — LACTIC ACID, PLASMA: Lactic Acid, Venous: 1.1 mmol/L (ref 0.5–1.9)

## 2021-10-05 LAB — AMMONIA: Ammonia: 45 umol/L — ABNORMAL HIGH (ref 9–35)

## 2021-10-05 MED ORDER — ASPIRIN EC 81 MG PO TBEC
81.0000 mg | DELAYED_RELEASE_TABLET | Freq: Every day | ORAL | Status: DC
  Administered 2021-10-05 – 2021-10-08 (×4): 81 mg via ORAL
  Filled 2021-10-05 (×4): qty 1

## 2021-10-05 MED ORDER — ACETAMINOPHEN 325 MG PO TABS: 650.0000 mg | ORAL_TABLET | Freq: Four times a day (QID) | ORAL | Status: DC | PRN

## 2021-10-05 MED ORDER — LORAZEPAM 2 MG/ML IJ SOLN
1.0000 mg | Freq: Once | INTRAMUSCULAR | Status: AC
  Administered 2021-10-05: 22:00:00 1 mg via INTRAVENOUS
  Filled 2021-10-05: qty 1

## 2021-10-05 MED ORDER — STERILE WATER FOR INJECTION IJ SOLN
INTRAMUSCULAR | Status: AC
  Administered 2021-10-05: 10:00:00 1 mL
  Filled 2021-10-05: qty 10

## 2021-10-05 MED ORDER — DROPERIDOL 2.5 MG/ML IJ SOLN: 5.0000 mg | Freq: Once | INTRAMUSCULAR | Status: DC

## 2021-10-05 MED ORDER — FENTANYL CITRATE PF 50 MCG/ML IJ SOSY
50.0000 ug | PREFILLED_SYRINGE | Freq: Once | INTRAMUSCULAR | Status: AC
Start: 2021-10-05 — End: 2021-10-05
  Administered 2021-10-05: 11:00:00 50 ug via INTRAVENOUS
  Filled 2021-10-05: qty 1

## 2021-10-05 MED ORDER — ATORVASTATIN CALCIUM 10 MG PO TABS
20.0000 mg | ORAL_TABLET | Freq: Every day | ORAL | Status: DC
  Administered 2021-10-05 – 2021-10-07 (×3): 20 mg via ORAL
  Filled 2021-10-05 (×3): qty 2

## 2021-10-05 MED ORDER — SODIUM CHLORIDE 0.9 % IV SOLN: INTRAVENOUS | Status: AC

## 2021-10-05 MED ORDER — ZIPRASIDONE MESYLATE 20 MG IM SOLR
20.0000 mg | Freq: Once | INTRAMUSCULAR | Status: AC
Start: 2021-10-05 — End: 2021-10-05
  Administered 2021-10-05: 10:00:00 20 mg via INTRAMUSCULAR
  Filled 2021-10-05: qty 20

## 2021-10-05 MED ORDER — HYDROXYZINE HCL 10 MG PO TABS
10.0000 mg | ORAL_TABLET | Freq: Two times a day (BID) | ORAL | Status: DC
  Administered 2021-10-05 – 2021-10-08 (×6): 10 mg via ORAL
  Filled 2021-10-05 (×8): qty 1

## 2021-10-05 MED ORDER — TOPIRAMATE 100 MG PO TABS
100.0000 mg | ORAL_TABLET | Freq: Two times a day (BID) | ORAL | Status: DC
  Administered 2021-10-05 – 2021-10-08 (×7): 100 mg via ORAL
  Filled 2021-10-05: qty 1
  Filled 2021-10-05: qty 4
  Filled 2021-10-05: qty 1
  Filled 2021-10-05: qty 4
  Filled 2021-10-05 (×2): qty 1
  Filled 2021-10-05: qty 4

## 2021-10-05 MED ORDER — CLONAZEPAM 0.5 MG PO TABS
1.0000 mg | ORAL_TABLET | Freq: Three times a day (TID) | ORAL | Status: DC
  Administered 2021-10-05 – 2021-10-08 (×9): 1 mg via ORAL
  Filled 2021-10-05 (×9): qty 2

## 2021-10-05 MED ORDER — LACTATED RINGERS IV BOLUS
1000.0000 mL | Freq: Once | INTRAVENOUS | Status: AC
  Administered 2021-10-05: 11:00:00 1000 mL via INTRAVENOUS

## 2021-10-05 MED ORDER — QUETIAPINE FUMARATE ER 200 MG PO TB24
200.0000 mg | ORAL_TABLET | Freq: Two times a day (BID) | ORAL | Status: DC
  Administered 2021-10-05 – 2021-10-08 (×5): 200 mg via ORAL
  Filled 2021-10-05 (×8): qty 1

## 2021-10-05 MED ORDER — LORAZEPAM 2 MG/ML IJ SOLN
INTRAMUSCULAR | Status: AC
  Administered 2021-10-05: 10:00:00 2 mg via INTRAMUSCULAR
  Filled 2021-10-05: qty 1

## 2021-10-05 MED ORDER — METOPROLOL SUCCINATE ER 100 MG PO TB24
200.0000 mg | ORAL_TABLET | Freq: Every day | ORAL | Status: DC
  Administered 2021-10-05 – 2021-10-08 (×4): 200 mg via ORAL
  Filled 2021-10-05: qty 8
  Filled 2021-10-05: qty 2
  Filled 2021-10-05: qty 8
  Filled 2021-10-05: qty 2

## 2021-10-05 MED ORDER — HALOPERIDOL LACTATE 5 MG/ML IJ SOLN
INTRAMUSCULAR | Status: AC
  Administered 2021-10-05: 10:00:00 5 mg
  Filled 2021-10-05: qty 1

## 2021-10-05 MED ORDER — HALOPERIDOL LACTATE 5 MG/ML IJ SOLN
2.0000 mg | Freq: Four times a day (QID) | INTRAMUSCULAR | Status: DC | PRN
  Administered 2021-10-05 – 2021-10-06 (×3): 2 mg via INTRAVENOUS
  Filled 2021-10-05 (×3): qty 1

## 2021-10-05 MED ORDER — HALOPERIDOL LACTATE 5 MG/ML IJ SOLN: 5.0000 mg | Freq: Once | INTRAMUSCULAR | Status: AC

## 2021-10-05 MED ORDER — QUETIAPINE FUMARATE ER 200 MG PO TB24: 200.0000 mg | ORAL_TABLET | Freq: Every day | ORAL | Status: DC

## 2021-10-05 MED ORDER — ENOXAPARIN SODIUM 40 MG/0.4ML IJ SOSY
40.0000 mg | PREFILLED_SYRINGE | INTRAMUSCULAR | Status: DC
  Administered 2021-10-05 – 2021-10-08 (×4): 40 mg via SUBCUTANEOUS
  Filled 2021-10-05 (×4): qty 0.4

## 2021-10-05 MED ORDER — LORAZEPAM 2 MG/ML IJ SOLN: 2.0000 mg | Freq: Once | INTRAMUSCULAR | Status: AC

## 2021-10-05 NOTE — ED Notes (Signed)
Unable to obtain temp 

## 2021-10-05 NOTE — ED Notes (Signed)
Placed pt on 4L O2 d/t sleep apnea.

## 2021-10-05 NOTE — ED Notes (Signed)
Pt is at the bedside yelling. Caregiver trying to redirect pt to calm him down.

## 2021-10-05 NOTE — ED Notes (Signed)
Pt becoming increasingly agitated. EDP notified, PRN haldol and ativan given per EDP.

## 2021-10-05 NOTE — ED Notes (Signed)
Pt given a cracker and a small bottle of water to see if he would calm down. Pt ate crackers and is drinking small water now. Pt still yelling. Pt became aggressive with caregiver.

## 2021-10-05 NOTE — ED Triage Notes (Signed)
Care giver stated, He has been sick since Sunday, he refuses to eat is hollering grabbing his stomach . Pt is aggressive toward caregiver, he is swinging at him. Went to Dr. Wilburn Mylar and he told us to bring him here.

## 2021-10-05 NOTE — ED Notes (Signed)
Pt is nonverbal and uses sign language minimally. The caregiver is at the bedside with pt. Pt is currently yelling and flailing arms. Two RN, EDP, and NT tried to calm pt to complete lab work. Pt is unable to lay calm. Pt pushed RN away when trying to put on gown. EDP placed orders to medication and soft restraints.

## 2021-10-05 NOTE — ED Notes (Signed)
Pt follows commands. He is able to take medication all at once. Pt loves drinking water.

## 2021-10-05 NOTE — ED Provider Notes (Signed)
[facility] EMERGENCY DEPARTMENT Provider Note   CSN: [id number] Arrival date & time: 10/05/21  [id number]     History  Chief Complaint  Patient presents with   Altered Mental Status   Abdominal Pain    Casey Fernandez is a 46 y.o. male.   Altered Mental Status Associated symptoms: abdominal pain   Abdominal Pain  46 year old male with a medical history significant for hypertension, dyslipidemia, diabetes mellitus type 2, morbid obesity, sleep apnea, anxiety, depression, nonverbal due to autism who presents to the emergency department with nausea, vomiting, apparent abdominal pain and agitated behavior.  Patient is nonverbal and sometimes can communicate through signing.  He has been aggressive towards his caregiver and has been swinging at him.  He had an episode of NBNB emesis and has not been eating at all since this past Sunday.  No numbers feel like he has been pointing towards his abdomen.  He has been altered from his baseline mental status and increasingly demonstrated episodes of agitation.  They feel that he could be in pain.  Home Medications Prior to Admission medications   Medication Sig Start Date End Date Taking? Authorizing Provider  acetaminophen (TYLENOL) 325 MG tablet Take 650 mg by mouth every 6 (six) hours as needed for mild pain or fever.    [provider]  aspirin EC 81 MG tablet Take 81 mg by mouth daily.    [provider]  atorvastatin (LIPITOR) 20 MG tablet Take 20 mg by mouth at bedtime. 07/11/19   [provider]  benzonatate (TESSALON) 100 MG capsule Take 100-200 mg by mouth every 8 (eight) hours. 05/07/21   [provider]  clonazePAM (KLONOPIN) 1 MG tablet Take 1 mg by mouth 3 (three) times daily. 0800, 1200, 1900 07/11/19   [provider]  furosemide (LASIX) 20 MG tablet Take 20 mg by mouth daily as needed. 09/10/21   [provider]  gabapentin (NEURONTIN) 600 MG tablet Take 600 mg  by mouth 3 (three) times daily. 09/23/21   [provider]  glimepiride (AMARYL) 2 MG tablet Take 2 mg by mouth every morning. 09/10/21   [provider]  hydrOXYzine (ATARAX) 25 MG tablet Take by mouth. 08/16/21   [provider]  hydrOXYzine (ATARAX/VISTARIL) 10 MG tablet Take 10 mg by mouth 2 (two) times daily. 06/13/19   [provider]  lithium carbonate (LITHOBID) 300 MG CR tablet Take 300-600 mg by mouth See admin instructions. Take 300mg  in the morning and 600mg  at bedtime. 07/04/19   [provider]  losartan (COZAAR) 25 MG tablet Take 25 mg by mouth daily. 07/05/19   [provider]  metFORMIN (GLUCOPHAGE) 500 MG tablet Take 500 mg by mouth 2 (two) times daily. 07/18/19   [provider]  metoprolol (TOPROL-XL) 200 MG 24 hr tablet Take 200 mg by mouth daily. 07/18/19   [provider]  oxybutynin (DITROPAN) 5 MG tablet Take 5 mg by mouth 2 (two) times daily. 09/10/21   [provider]  polyethylene glycol powder (GLYCOLAX/MIRALAX) 17 GM/SCOOP powder Take 17 g by mouth daily. 07/18/19   [provider]  QUEtiapine (SEROQUEL XR) 300 MG 24 hr tablet Take 300 mg by mouth 2 (two) times daily. 06/27/19   [provider]  QUEtiapine (SEROQUEL XR) 400 MG 24 hr tablet SMARTSIG:1 Tablet(s) By Mouth Morning-Evening 08/27/21   [provider]  topiramate (TOPAMAX) 100 MG tablet Take 100 mg by mouth 2 (two) times daily. 07/04/19  [provider]  triamcinolone cream (KENALOG) 0.1 % Apply topically 2 (two) times daily as needed. 06/16/21   [provider]  Vitamin D, Ergocalciferol, (DRISDOL) 1.25 MG (50000 UNIT) CAPS capsule Take 50,000 Units by mouth once a week. 09/10/21   [provider]      Allergies    Patient has no known allergies.    Review of Systems   Review of Systems  Unable to perform ROS: Patient nonverbal  Gastrointestinal:  Positive for abdominal pain.    Physical Exam Updated Vital Signs BP (!) 147/92    Pulse 82    Temp 98.1 F (36.7 C) (Oral)    Resp 20    Ht 5\' 11"  (1.803 m)    Wt 103.9 kg    SpO2 99%    BMI 31.94 kg/m  Physical Exam Vitals and nursing note reviewed. Exam conducted with a chaperone present.  Constitutional:      General: He is not in acute distress.    Comments: Agitated, constantly making verbal noises, nonspecifically pointing, signing to his caregiver that he would like something to drink and that he needs to use the bathroom.   HENT:     Head: Normocephalic and atraumatic.  Eyes:     Conjunctiva/sclera: Conjunctivae normal.     Pupils: Pupils are equal, round, and reactive to light.  Cardiovascular:     Rate and Rhythm: Normal rate and regular rhythm.  Pulmonary:     Effort: Pulmonary effort is normal. No respiratory distress.     Breath sounds: Normal breath sounds.  Abdominal:     General: There is no distension.     Tenderness: There is no abdominal tenderness. There is no guarding or rebound.  Genitourinary:    Testes: Cremasteric reflex is present.        Right: Tenderness or swelling not present.        Left: Tenderness or swelling not present.  Musculoskeletal:        General: No deformity or signs of injury.     Cervical back: Neck supple.  Skin:    Findings: No lesion or rash.  Neurological:     General: No focal deficit present.     Mental Status: He is alert. Mental status is at baseline.  Psychiatric:     Comments: Agitated, did make aggressive attempts to push staff away at times    ED Results / Procedures / Treatments   Labs (all labs ordered are listed, but only abnormal results are displayed) Labs Reviewed  COMPREHENSIVE METABOLIC PANEL - Abnormal; Notable for the following components:      Result Value   Chloride 115 (*)    CO2 18 (*)    BUN 32 (*)    Creatinine, Ser 3.01 (*)    Calcium 8.7 (*)    Albumin 3.3 (*)    GFR, Estimated 25 (*)    All other components within  normal limits  CBC WITH DIFFERENTIAL/PLATELET - Abnormal; Notable for the following components:   RBC 3.45 (*)    Hemoglobin 10.2 (*)    HCT 33.2 (*)    All other components within normal limits  URINALYSIS, ROUTINE W REFLEX MICROSCOPIC - Abnormal; Notable for the following components:   Color, Urine STRAW (*)    All other components within normal limits  AMMONIA - Abnormal; Notable for the following components:   Ammonia 45 (*)    All other components within normal limits  CBC - Abnormal; Notable for  the following components:   RBC 3.74 (*)    Hemoglobin 11.4 (*)    HCT 35.9 (*)    Platelets 137 (*)    All other components within normal limits  CREATININE, SERUM - Abnormal; Notable for the following components:   Creatinine, Ser 2.84 (*)    GFR, Estimated 27 (*)    All other components within normal limits  CBG MONITORING, ED - Abnormal; Notable for the following components:   Glucose-Capillary 147 (*)    All other components within normal limits  I-STAT VENOUS BLOOD GAS, ED - Abnormal; Notable for the following components:   pCO2, Ven 27.0 (*)    pO2, Ven 129.0 (*)    Bicarbonate 17.6 (*)    TCO2 18 (*)    Acid-base deficit 6.0 (*)    Calcium, Ion 1.14 (*)    HCT 30.0 (*)    Hemoglobin 10.2 (*)    All other components within normal limits  I-STAT CHEM 8, ED - Abnormal; Notable for the following components:   Sodium 146 (*)    Chloride 116 (*)    BUN 29 (*)    Creatinine, Ser 3.00 (*)    TCO2 19 (*)    Hemoglobin 10.2 (*)    HCT 30.0 (*)    All other components within normal limits  RESP PANEL BY RT-PCR (FLU A&B, COVID) ARPGX2  CULTURE, BLOOD (ROUTINE X 2)  CULTURE, BLOOD (ROUTINE X 2)  URINE CULTURE  ETHANOL  LACTIC ACID, PLASMA  RAPID URINE DRUG SCREEN, HOSP PERFORMED  HIV ANTIBODY (ROUTINE TESTING W REFLEX)  BLOOD GAS, VENOUS  LIPASE, BLOOD  LITHIUM LEVEL  TSH  CBC  COMPREHENSIVE METABOLIC PANEL  CBG MONITORING, ED    EKG None  Radiology CT ABDOMEN  PELVIS WO CONTRAST  Result Date: 10/05/2021 CLINICAL DATA:  Abdominal pain EXAM: CT ABDOMEN AND PELVIS WITHOUT CONTRAST TECHNIQUE: Multidetector CT imaging of the abdomen and pelvis was performed following the standard protocol without IV contrast. RADIATION DOSE REDUCTION: This exam was performed according to the departmental dose-optimization program which includes automated exposure control, adjustment of the mA and/or kV according to patient size and/or use of iterative reconstruction technique. COMPARISON:  Radiograph July 25, 2019. FINDINGS: Despite efforts by the technologist and patient, motion artifact is present on today's exam and could not be eliminated. This reduces exam sensitivity and specificity. Lower chest: Hypoventilatory change in the lung bases. Hepatobiliary: Hypodense 5 mm lesion in the right hepatic lobe on image 17/3 is technically too small to accurately characterize but statistically likely reflect a benign etiology such as a cyst or hemangioma. Gallbladder is unremarkable. No biliary ductal dilation. Pancreas: No pancreatic ductal dilation or evidence of acute inflammation. Spleen: Normal in size without focal abnormality. Adrenals/Urinary Tract: Bilateral adrenal glands appear normal. No hydronephrosis. No renal, ureteral or bladder calculi identified. Urinary bladder is unremarkable for degree of distension. Stomach/Bowel: No enteric contrast was administered. Stomach is unremarkable for degree of distension. No pathologic dilation of small or large bowel. The appendix and terminal ileum appear normal. No evidence of acute bowel inflammation. Vascular/Lymphatic: No significant vascular findings are present. No enlarged abdominal or pelvic lymph nodes. Reproductive: Prostate is unremarkable. Other: No significant abdominopelvic free fluid. Musculoskeletal: No acute or significant osseous findings. IMPRESSION: No acute abnormality in the abdomen or pelvis on this motion degraded  examination. Electronically Signed   By: Dahlia Bailiff M.D.   On: 10/05/2021 11:49   CT HEAD WO CONTRAST (5MM)  Result Date:  10/05/2021 CLINICAL DATA:  Mental status change. EXAM: CT HEAD WITHOUT CONTRAST TECHNIQUE: Contiguous axial images were obtained from the base of the skull through the vertex without intravenous contrast. RADIATION DOSE REDUCTION: This exam was performed according to the departmental dose-optimization program which includes automated exposure control, adjustment of the mA and/or kV according to patient size and/or use of iterative reconstruction technique. COMPARISON:  None. FINDINGS: Brain: No evidence of acute infarction, hemorrhage, hydrocephalus, extra-axial collection or mass lesion/mass effect. Vascular: Negative for hyperdense vessel Skull: Negative Sinuses/Orbits: Mucosal edema left maxillary sinus. Remaining sinuses clear. Negative orbit Other: None IMPRESSION: Negative CT head Electronically Signed   By: Franchot Gallo M.D.   On: 10/05/2021 11:46   DG Chest Portable 1 View  Result Date: 10/05/2021 CLINICAL DATA:  Altered mental status EXAM: PORTABLE CHEST 1 VIEW COMPARISON:  Chest x-ray dated July 28, 2019 FINDINGS: Cardiac and mediastinal contours are unchanged. Lungs are clear. No evidence of pleural effusion or pneumothorax. IMPRESSION: No active disease. Electronically Signed   By: Yetta Glassman M.D.   On: 10/05/2021 10:23    Procedures Procedures    Medications Ordered in ED Medications  acetaminophen (TYLENOL) tablet 650 mg (has no administration in time range)  aspirin EC tablet 81 mg (81 mg Oral Given 10/05/21 1627)  atorvastatin (LIPITOR) tablet 20 mg (has no administration in time range)  metoprolol succinate (TOPROL-XL) 24 hr tablet 200 mg (200 mg Oral Given 10/05/21 1632)  hydrOXYzine (ATARAX) tablet 10 mg (has no administration in time range)  clonazePAM (KLONOPIN) tablet 1 mg (1 mg Oral Given 10/05/21 1631)  topiramate (TOPAMAX) tablet 100 mg  (100 mg Oral Given 10/05/21 1630)  enoxaparin (LOVENOX) injection 40 mg (40 mg Subcutaneous Given 10/05/21 1635)  0.9 %  sodium chloride infusion ( Intravenous New Bag/Given 10/05/21 1638)  QUEtiapine (SEROQUEL XR) 24 hr tablet 200 mg (has no administration in time range)  haloperidol lactate (HALDOL) injection 2 mg (has no administration in time range)  LORazepam (ATIVAN) injection 2 mg (2 mg Intramuscular Given 10/05/21 0938)  haloperidol lactate (HALDOL) injection 5 mg (5 mg Intramuscular Given 10/05/21 0937)  fentaNYL (SUBLIMAZE) injection 50 mcg (50 mcg Intravenous Given 10/05/21 1034)  lactated ringers bolus 1,000 mL (0 mLs Intravenous Stopped 10/05/21 1245)  ziprasidone (GEODON) injection 20 mg (20 mg Intramuscular Given 10/05/21 1002)  sterile water (preservative free) injection (1 mL  Given 10/05/21 1004)    ED Course/ Medical Decision Making/ A&P                           Medical Decision Making Amount and/or Complexity of Data Reviewed Labs: ordered. Radiology: ordered.  Risk Prescription drug management. Decision regarding hospitalization.   46 year old male with a medical history significant for hypertension, dyslipidemia, diabetes mellitus type 2, morbid obesity, sleep apnea, anxiety, depression, nonverbal due to autism who presents to the emergency department with nausea, vomiting, apparent abdominal pain and agitated behavior.  Patient is nonverbal and sometimes can communicate through signing.  He has been aggressive towards his caregiver and has been swinging at him.  He had an episode of NBNB emesis and has not been eating at all since this past Sunday.  No numbers feel like he has been pointing towards his abdomen.  He has been altered from his baseline mental status and increasingly demonstrated episodes of agitation.  They feel that he could be in pain.  On arrival, the patient was agitated, intermittently aggressive and uncooperative  with staff, attempting nonverbal means  of communication with caregivers at bedside.  Appears to be uncomfortable.  Vitals on arrival significant for heart rate 107, RR 20, BP 143/104, unable to initially obtain temperature due to lack of patient cooperation, subsequently afebrile 98.1, satting 96% on room air.  Due to the patient's ongoing agitation and aggression, restraints were ordered and placed to maintain staff and patient's safety.  The patient was administered 2 mg of IM Ativan and 5 mg of IM Haldol and continue to be agitated.  He subsequently required an additional 20 mg of IM Geodon before becoming sedated enough for staff to initiate a work-up with IV access, lab draw and CT imaging.  The patient is clearly agitated from his baseline per caregivers from his group home.  He has been restless, with an episode of vomiting recently, NBNB, and has been having poor p.o. intake.  He appears very uncomfortable compared to his baseline.  No other infectious symptoms noted.  Broad work-up for potential abdominal pain initiated.  Differential diagnosis is broad and includes nephrolithiasis, small bowel obstruction, gastritis, GERD, pancreatitis, diverticulitis, cholelithiasis/cholecystitis, choledocholithiasis, colitis, UTI, considered testicular torsion although no testicular pain on exam with  intact cremasterics bilaterally.   Laboratory work-up performed significant for urinalysis without hematuria or evidence of UTI, UDS negative, VBG without an acidosis, pH 7.42, HCO3 17.6, PCO2 27 likely from hyperventilation.  This VBG does reveal evidence of a metabolic acidosis with respiratory compensation.  A CMP revealed a bicarb of 18, and a normal anion gap of 9, and AKI with a creatinine of 3.01 with an elevated BUN to 32 from baseline around 1.8.  COVID-19 and influenza testing was negative, lactic acid was normal, CBC was without a leukocytosis.  Imaging work-up obtained to include a chest x-ray, CT head and CT abdomen pelvis all without acute  abnormalities noted.  CT abdomen pelvis was performed without contrast given the patient's AKI.  The patient was administered an IV fluid bolus for volume resuscitation.  No evidence for urinary retention as the patient voided 600 cc in the ED.  No urinary retention noted on CT abdomen and this was performed before his void.  Due to the patient's AKI in the setting of poor oral intake, agitation and pain of unclear etiology, hospitalist medicine was consulted for admission and further work-up.  I spoke with Dr. Broadus John who accepted the patient in admission.   Final Clinical Impression(s) / ED Diagnoses Final diagnoses:  AKI (acute kidney injury) ([place])  Agitation    Rx / DC Orders ED Discharge Orders     None         Regan Lemming, MD 10/05/21 1806

## 2021-10-05 NOTE — ED Notes (Signed)
Pt was provided a pitcher of water. Linens, brief, and blankets changed.

## 2021-10-05 NOTE — H&P (Addendum)
History and Physical    Casey Fernandez DGL:[id number] DOB: 1976/01/25 DOA: 10/05/2021  PCP: [facility]   Chief Complaint: Not acting right  HPI: Casey Fernandez is a 46 y.o. male with medical history significant for severe autism, nonverbal at baseline, mumbles, anxiety, depression, type 2 diabetes mellitus, hypertension, dyslipidemia, obstructive sleep apnea was brought to the ED by his caregivers from the group home today with agitation.  History is provided by caregiver at bedside, usually at baseline patient is calm, eats his meals takes his meds, ambulates but nonverbal and mumbles for last 2 days he has been restless, caregiver reports that he had an episode of vomiting on 1/22, since then has been eating and drinking less, appeared uncomfortable and agitated, no further episodes of vomiting, no fevers chills or diarrhea, no cough congestion shortness of breath noted. -Due to the symptoms he was brought to the ED -In the ED he was afebrile, vitals were stable, labs noted creatinine of 3.0, last creatinine in our system was from 07/2019 at 1.9, WBC was normal, hemoglobin stable at 10.2, lactic acid level was normal, COVID PCR, influenza, urinalysis, chest x-ray, CT head and noncontrast CT abdomen pelvis were unremarkable -He required Ativan, fentanyl and Geodon to be sedated for imaging studies -His caregiver reports that he was taken off lithium few months ago and started on gabapentin about a month back   Review of Systems: ROS  -limited, patient is nonverbal  No Known Allergies  Past Medical History:  Diagnosis Date   Anxiety with depression    Autism    Autistic disorder    DM (diabetes mellitus) (HCC)    HLD (hyperlipidemia)    HTN (hypertension)    Obesity    Sleep apnea     Past Surgical History:  Procedure Laterality Date   Reviewed and found to be negative       reports that he has never smoked. He has never used smokeless tobacco. He reports that he does not  drink alcohol and does not use drugs.  Family History  Family history unknown: Yes    Prior to Admission medications   Medication Sig Start Date End Date Taking? Authorizing Provider  acetaminophen (TYLENOL) 325 MG tablet Take 650 mg by mouth every 6 (six) hours as needed for mild pain or fever.    [provider]  aspirin EC 81 MG tablet Take 81 mg by mouth daily.    [provider]  atorvastatin (LIPITOR) 20 MG tablet Take 20 mg by mouth at bedtime. 07/11/19   [provider]  clonazePAM (KLONOPIN) 1 MG tablet Take 1 mg by mouth 3 (three) times daily. 0800, 1200, 1900 07/11/19   [provider]  hydrOXYzine (ATARAX/VISTARIL) 10 MG tablet Take 10 mg by mouth 2 (two) times daily. 06/13/19   [provider]  lithium carbonate (LITHOBID) 300 MG CR tablet Take 300-600 mg by mouth See admin instructions. Take 300mg  in the morning and 600mg  at bedtime. 07/04/19   [provider]  losartan (COZAAR) 25 MG tablet Take 25 mg by mouth daily. 07/05/19   [provider]  metFORMIN (GLUCOPHAGE) 500 MG tablet Take 500 mg by mouth 2 (two) times daily. 07/18/19   [provider]  metoprolol (TOPROL-XL) 200 MG 24 hr tablet Take 200 mg by mouth daily. 07/18/19   [provider]  polyethylene glycol powder (GLYCOLAX/MIRALAX) 17 GM/SCOOP powder Take 17 g by mouth daily. 07/18/19   [provider]  QUEtiapine (  SEROQUEL XR) 300 MG 24 hr tablet Take 300 mg by mouth 2 (two) times daily. 06/27/19   [provider]  topiramate (TOPAMAX) 100 MG tablet Take 100 mg by mouth 2 (two) times daily. 07/04/19   [provider]    Physical Exam: Vitals:   10/05/21 1145 10/05/21 1200 10/05/21 1230 10/05/21 1246  BP: 137/65 (!) 144/73 (!) 142/78   Pulse: 73 73 73   Resp: (!) 24 16 20    Temp:    98.1 F (36.7 C)  TempSrc:    Oral  SpO2: 100% 100% 100%    Physical Exam  Average built male laying in bed, awake alert,  mumbling and yelling intermittently, appears to be in no distress HEENT: Pupils are equal and reactive, oral mucosa is dry CVS: S1-S2, regular rhythm Lungs: Clear bilaterally Abdomen: Obese, soft, nontender, stretch marks noted, bowel sounds present Extremities: No edema, dry skin Neuro: Moves all extremities, no localizing signs    Labs on Admission: I have personally reviewed the patients's labs and imaging studies.  Assessment/Plan    Agitation  -Etiology is unclear at this time, a part of this could be AKI with renal retention of meds -Caregiver reports that he has been off lithium, will check levels to confirm -Also check ammonia level, EKG, TSH -Infectious work-up -flu, COVID PCR, UA chest x-ray and CT abdomen unrevealing, no urinary retention noted, patient voided since arrival in the ED -Unclear if he could have had a viral illness few days ago, afebrile and nontoxic at this time -Restart Seroquel, will decrease dose to 200 mg twice daily in the setting of AKI -Hold gabapentin, new med which can have retention in AKI -Haldol PRN for severe agitation  Acute kidney injury -Baseline creatinine unknown, last hospitalized in 11/20 with COVID, and AKI, creatinine was >3 on admission then, improved to 1.9 at discharge -Suspect he may have some degree of CKD at baseline, known diabetes -Add IV fluids today, hold losartan -No urinary retention noted on CT abdomen  Type 2 diabetes mellitus -Hold metformin, check HbA1c -CBGs are normal, will hold off on sliding scale insulin  Severe autism Anxiety Depression -Continue Klonopin, hydroxyzine, Topamax per home regimen -Decreased Seroquel dose -Holding gabapentin -Per caregiver he is no longer on lithium -May need psych assistance depending on hospital course  Hypertension -Toprol, he is on high-dose 200 Mg daily, resumed  Chronic anemia -Hemoglobin stable compared to last admission from 2020  DVT prophylaxis: Lovenox  low-dose CODE STATUS: Full code Family communication: No family at bedside, updated caregiver Disposition: Back to group home pending improvement in clinical condition  Admission status: Inpatient Med-Surg  2021 MD [facility] If 7PM-7AM, please contact night-coverage [email or web address]  10/05/2021, 1:48 PM

## 2021-10-05 NOTE — ED Notes (Signed)
Pt dinner tray at bedside. RN will assist pt eating meal.

## 2021-10-06 DIAGNOSIS — R451 Restlessness and agitation: Secondary | ICD-10-CM | POA: Diagnosis not present

## 2021-10-06 LAB — COMPREHENSIVE METABOLIC PANEL
ALT: 27 U/L (ref 0–44)
AST: 34 U/L (ref 15–41)
Albumin: 3.2 g/dL — ABNORMAL LOW (ref 3.5–5.0)
Alkaline Phosphatase: 84 U/L (ref 38–126)
Anion gap: 7 (ref 5–15)
BUN: 28 mg/dL — ABNORMAL HIGH (ref 6–20)
CO2: 21 mmol/L — ABNORMAL LOW (ref 22–32)
Calcium: 8.8 mg/dL — ABNORMAL LOW (ref 8.9–10.3)
Chloride: 112 mmol/L — ABNORMAL HIGH (ref 98–111)
Creatinine, Ser: 2.6 mg/dL — ABNORMAL HIGH (ref 0.61–1.24)
GFR, Estimated: 30 mL/min — ABNORMAL LOW (ref >60)
Glucose, Bld: 104 mg/dL — ABNORMAL HIGH (ref 70–99)
Potassium: 4.1 mmol/L (ref 3.5–5.1)
Sodium: 140 mmol/L (ref 135–145)
Total Bilirubin: 0.5 mg/dL (ref 0.3–1.2)
Total Protein: 6.4 g/dL — ABNORMAL LOW (ref 6.5–8.1)

## 2021-10-06 LAB — CBC
HCT: 35 % — ABNORMAL LOW (ref 39.0–52.0)
Hemoglobin: 11.1 g/dL — ABNORMAL LOW (ref 13.0–17.0)
MCH: 30.3 pg (ref 26.0–34.0)
MCHC: 31.7 g/dL (ref 30.0–36.0)
MCV: 95.6 fL (ref 80.0–100.0)
Platelets: 183 10*3/uL (ref 150–400)
RBC: 3.66 MIL/uL — ABNORMAL LOW (ref 4.22–5.81)
RDW: 13.4 % (ref 11.5–15.5)
WBC: 3.6 10*3/uL — ABNORMAL LOW (ref 4.0–10.5)
nRBC: 0 % (ref 0.0–0.2)

## 2021-10-06 LAB — CBG MONITORING, ED
Glucose-Capillary: 88 mg/dL (ref 70–99)
Glucose-Capillary: 83 mg/dL (ref 70–99)
Glucose-Capillary: 103 mg/dL — ABNORMAL HIGH (ref 70–99)
Glucose-Capillary: 100 mg/dL — ABNORMAL HIGH (ref 70–99)

## 2021-10-06 LAB — URINE CULTURE

## 2021-10-06 LAB — GLUCOSE, CAPILLARY: Glucose-Capillary: 86 mg/dL (ref 70–99)

## 2021-10-06 MED ORDER — SODIUM CHLORIDE 0.9 % IV SOLN: INTRAVENOUS | Status: DC

## 2021-10-06 MED ORDER — GABAPENTIN 400 MG PO CAPS
400.0000 mg | ORAL_CAPSULE | Freq: Two times a day (BID) | ORAL | Status: DC
  Administered 2021-10-06 – 2021-10-08 (×5): 400 mg via ORAL
  Filled 2021-10-06 (×5): qty 1

## 2021-10-06 MED ORDER — INSULIN ASPART 100 UNIT/ML IJ SOLN: 0.0000 [IU] | Freq: Every day | INTRAMUSCULAR | Status: DC

## 2021-10-06 MED ORDER — INSULIN ASPART 100 UNIT/ML IJ SOLN: 0.0000 [IU] | Freq: Three times a day (TID) | INTRAMUSCULAR | Status: DC

## 2021-10-06 NOTE — ED Notes (Signed)
Dinner tray delivered to pt at this time.  °

## 2021-10-06 NOTE — ED Notes (Signed)
Dr. [place] assessing pt at this time.

## 2021-10-06 NOTE — Progress Notes (Addendum)
New Admission Note:  Arrival Method: Stretcher Mental Orientation: Alert and responsive. Incoherent words Telemetry: Refused Assessment: Completed Skin: Warm and dry IV: NSL Pain: No sign of pain  Tubes: N/A Safety Measures: Safety Fall Prevention Plan initiated.  Admission: Unable to complete 5 M  Orientation:  Sever Autism  Family: N/A  Orders have been reviewed and implemented. Will continue to monitor the patient. Call light has been placed within reach and bed alarm has been activated.   Guilford Shi BSN, RN  Phone Number: [phone]

## 2021-10-06 NOTE — ED Notes (Addendum)
Attempted to obtain morning labs, unable to, er lab notified

## 2021-10-06 NOTE — ED Notes (Signed)
Message sent to pharmacy to send Seroquel and Atarax.

## 2021-10-06 NOTE — ED Notes (Signed)
After medication administration, pt became agitated, yelling at staff and pointing at restraints. Restraints removed, skin examined to ensure pt was not in pain from restraint use. Skin intact, no evidence of bruising or obvious injury. Pt then began thrashing in bed. Pt turned over to lay on belly, then began hitting himself on the bottom. This RN left room to retrieve supplies to clean up pt of urinary incontinence and change linens. Upon return, pt OOB, brief torn off and thrown onto floor. Pt continues yelling and pointing at IV pump. IV saline locked and pt ambulated to BR. Pt had BM, then began flushing toilet multiple times. Pt refused to put clean brief or gown on. Security called to assist in getting patient back to room safely. Pt ambulated back to room with assistance X 2 security guards. Pt cleaned up from BM, clean brief and gown placed on patient. Pt provided with water. Warm blankets placed on pt. Restraints left off patient at this time. Pt cooperative and redirectable.

## 2021-10-06 NOTE — ED Notes (Signed)
Pt resting in bed with eyes closed, blanket over face. Lunch tray delivered to pt.

## 2021-10-06 NOTE — Progress Notes (Addendum)
PROGRESS NOTE    Casey Fernandez  [id number] DOB: Nov 30, 1975 DOA: 10/05/2021 PCP: [facility]    Brief Narrative:  Casey Fernandez is a 46 year old male with past medical history significant for severe autism, nonverbal at baseline, anxiety/depression, type 2 diabetes mellitus, essential hypertension, dyslipidemia, OSA who presented to [facility] ED on 1/24 from his group home with agitation.  History provided by caregiver at bedside, usually patient is calm, eats his meals, takes his medication, ambulates at baseline.  Patient reportedly had an episode of vomiting on 1/22; since he has been eating and drinking less with appearance of being uncomfortable, agitated.  No further episodes of vomiting, no fever/chills/diarrhea, no cough/congestion, no shortness of breath noted.  Due to his symptoms he was brought to the ED for further evaluation.  Caregiver reports, patient was taken off of lithium a few months ago and started on gabapentin 1 month ago.  In the ED, temperature 98.1 F, HR 107, HR 93, RR 20, BP 143/104, SPO2 96% on room air.  Sodium 142, potassium 3.7, chloride 115, CO2 18, glucose 95, BUN 32, creatinine 3.01, lactic acid 1.1.  WBC 4.3, hemoglobin 10.2, platelets 190.  COVID-19 PCR negative.  Influenza A/B PCR negative.  Urinalysis unrevealing.  UDS negative.  CT head without contrast with no acute intracranial findings.  CT abdomen/pelvis without contrast with no acute abnormality in abdomen/pelvis.  EDP consulted [facility] for further evaluation and management of acute renal failure and agitation.   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  Creatinine on admission noted to be elevated 3.01.  Baseline from 2020 appears  to be 1.75-2.0.  --Cr 3.01>2.84>2.60 --Continue IV fluid hydration with NS at 100 mL/h --Hold home losartan/furosemide --Avoid nephrotoxins, renally dose all medications --Repeat BMP in a.m.  Agitation Hx severe autism, nonverbal at baseline Depression Patient presenting to the ED with agitation, usually calm, takes his medication eats fairly consistently at baseline.  Patient is afebrile without leukocytosis.  Urinalysis unrevealing.  CT head without contrast with no acute intracranial abnormality.  CT abdomen/pelvis negative.  Ammonia level slightly elevated on admission to 45.  HIV nonreactive.  EtOH level less than 10.  UDS negative.  Also with acute on chronic renal failure in the setting of decreased oral intake.  Patient also recently stopped taking lithium about 3 months ago per caregiver. --Lithium level: Pending --Clonazepam 1 mg p.o. 3 times daily --Seroquel 20 mg p.o. twice daily --Topamax 100 mg p.o. twice daily --Hydroxyzine 10 mg p.o. twice daily. --Gabapentin at reduced dose 4 mg p.o. twice daily (on 600mg  TID outpatient) --Haldol 2 mg IV every 6 hours as needed agitation --Repeat ammonia level in a.m. --Lithium level in a.m.  Type 2 diabetes mellitus Hemoglobin A1c 7.5.  Home regimen includes glimepiride 2 mg p.o. daily. --Hold oral hypoglycemics while inpatient --SSI for coverage --CBGs qAC/HS  Essential hypertension --Continue metoprolol succinate 200 mg p.o. daily --Holding home losartan/furosemide --Monitor BP closely  Anemia Hemoglobin 11.1, stable.   DVT prophylaxis: enoxaparin (LOVENOX) injection 40 mg Start: 10/05/21 1400   Code Status: Full Code Family Communication: No family/caregiver present at bedside  Disposition Plan:  Level of care: Med-Surg Status is: Inpatient  Remains inpatient appropriate because: IV fluid hydration, anticipate likely discharge back to group home tomorrow if renal function remained stable and oral intake increases     Consultants:  None  Procedures:  None  Antimicrobials:  None   Subjective: Patient seen examined at bedside, remains in ED holding area.  Mumbling.  Labs pending for this morning.  No caregiver present at bedside.  Unable obtain any further ROS from the patient.  Per RN, continues with intermittent agitation.  Appears when he is likely restrained.  No other acute concerns per RN this morning.  Objective: Vitals:   10/06/21 0030 10/06/21 0120 10/06/21 0756 10/06/21 1152  BP: (!) 151/69 (!) 148/88 (!) 128/92 140/86  Pulse: 60 76 70 73  Resp: (!) 22 20 18 16   Temp:   98.4 F (36.9 C)   TempSrc:   Oral   SpO2: 96% 99% 100% 98%  Weight:      Height:        Intake/Output Summary (Last 24 hours) at 10/06/2021 1451 Last data filed at 10/06/2021 1404 Gross per 24 hour  Intake 1996.57 ml  Output --  Net 1996.57 ml   Filed Weights   10/05/21 1409  Weight: 103.9 kg    Examination:  General exam: Appears calm and comfortable; with intermittent episodes of agitation, nonverbal mumbling Respiratory system: Clear to auscultation. Respiratory effort normal.  On room air Cardiovascular system: S1 & S2 heard, RRR. No JVD, murmurs, rubs, gallops or clicks. No pedal edema. Gastrointestinal system: Abdomen is nondistended, soft and nontender. No organomegaly or masses felt. Normal bowel sounds heard. Central nervous system: Alert, not oriented to person/place/time or situation.  Extremities: Symmetric 5 x 5 power. Skin: No rashes, lesions or ulcers Psychiatry: Slightly agitated mood; unable to assess further due to baseline autism and nonverbal at baseline    Data Reviewed: I have personally reviewed following labs and imaging studies  CBC: Recent Labs  Lab 10/05/21 1042 10/05/21 1055 10/05/21 1119 10/05/21 1358 10/06/21 0832  WBC 4.3  --   --  4.5 3.6*  NEUTROABS 2.4  --   --   --   --   HGB 10.2* 10.2* 10.2* 11.4* 11.1*  HCT 33.2* 30.0* 30.0* 35.9* 35.0*  MCV 96.2  --    --  96.0 95.6  PLT 190  --   --  137* [id number]   Basic Metabolic Panel: Recent Labs  Lab 10/05/21 1042 10/05/21 1055 10/05/21 1119 10/05/21 1358 10/06/21 0832  NA 142 145 146*  --  140  K 3.7 3.7 3.7  --  4.1  CL 115*  --  116*  --  112*  CO2 18*  --   --   --  21*  GLUCOSE 95  --  79  --  104*  BUN 32*  --  29*  --  28*  CREATININE 3.01*  --  3.00* 2.84* 2.60*  CALCIUM 8.7*  --   --   --  8.8*   GFR: Estimated Creatinine Clearance: 44.5 mL/min (A) (by C-G formula based on SCr of 2.6 mg/dL (H)). Liver Function Tests: Recent Labs  Lab 10/05/21 1042 10/06/21 0832  AST 36 34  ALT 27 27  ALKPHOS 68 84  BILITOT 0.5 0.5  PROT 6.5 6.4*  ALBUMIN 3.3* 3.2*   No results for input(s): LIPASE, AMYLASE in the last 168 hours. Recent Labs  Lab 10/05/21 1042  AMMONIA 45*   Coagulation Profile: No results for input(s): INR, PROTIME in the last  168 hours. Cardiac Enzymes: No results for input(s): CKTOTAL, CKMB, CKMBINDEX, TROPONINI in the last 168 hours. BNP (last 3 results) No results for input(s): PROBNP in the last 8760 hours. HbA1C: No results for input(s): HGBA1C in the last 72 hours. CBG: Recent Labs  Lab 10/05/21 0853 10/05/21 1415 10/06/21 0125 10/06/21 0802 10/06/21 1159  GLUCAP 147* 71 88 83 103*   Lipid Profile: No results for input(s): CHOL, HDL, LDLCALC, TRIG, CHOLHDL, LDLDIRECT in the last 72 hours. Thyroid Function Tests: No results for input(s): TSH, T4TOTAL, FREET4, T3FREE, THYROIDAB in the last 72 hours. Anemia Panel: No results for input(s): VITAMINB12, FOLATE, FERRITIN, TIBC, IRON, RETICCTPCT in the last 72 hours. Sepsis Labs: Recent Labs  Lab 10/05/21 1042  LATICACIDVEN 1.1    Recent Results (from the past 240 hour(s))  Resp Panel by RT-PCR (Flu A&B, Covid)     Status: None   Collection Time: 10/05/21  9:21 AM   Specimen: Nasopharyngeal(NP) swabs in vial transport medium  Result Value Ref Range Status   SARS Coronavirus 2 by RT PCR NEGATIVE  NEGATIVE Final    Comment: (NOTE) SARS-CoV-2 target nucleic acids are NOT DETECTED.  The SARS-CoV-2 RNA is generally detectable in upper respiratory specimens during the acute phase of infection. The lowest concentration of SARS-CoV-2 viral copies this assay can detect is 138 copies/mL. A negative result does not preclude SARS-Cov-2 infection and should not be used as the sole basis for treatment or other patient management decisions. A negative result may occur with  improper specimen collection/handling, submission of specimen other than nasopharyngeal swab, presence of viral mutation(s) within the areas targeted by this assay, and inadequate number of viral copies(<138 copies/mL). A negative result must be combined with clinical observations, patient history, and epidemiological information. The expected result is Negative.  Fact Sheet for Patients:  EntrepreneurPulse.com.au  Fact Sheet for Healthcare Providers:  IncredibleEmployment.be  This test is no t yet approved or cleared by the [place] FDA and  has been authorized for detection and/or diagnosis of SARS-CoV-2 by FDA under an Emergency Use Authorization (EUA). This EUA will remain  in effect (meaning this test can be used) for the duration of the COVID-19 declaration under Section 564(b)(1) of the Act, 21 U.S.C.section 360bbb-3(b)(1), unless the authorization is terminated  or revoked sooner.       Influenza A by PCR NEGATIVE NEGATIVE Final   Influenza B by PCR NEGATIVE NEGATIVE Final    Comment: (NOTE) The Xpert Xpress SARS-CoV-2/FLU/RSV plus assay is intended as an aid in the diagnosis of influenza from Nasopharyngeal swab specimens and should not be used as a sole basis for treatment. Nasal washings and aspirates are unacceptable for Xpert Xpress SARS-CoV-2/FLU/RSV testing.  Fact Sheet for Patients: EntrepreneurPulse.com.au  Fact Sheet for Healthcare  Providers: IncredibleEmployment.be  This test is not yet approved or cleared by the [place] FDA and has been authorized for detection and/or diagnosis of SARS-CoV-2 by FDA under an Emergency Use Authorization (EUA). This EUA will remain in effect (meaning this test can be used) for the duration of the COVID-19 declaration under Section 564(b)(1) of the Act, 21 U.S.C. section 360bbb-3(b)(1), unless the authorization is terminated or revoked.  Performed at [facility], [place] [address], [place], [place] [address]   Blood Cultures (routine x 2)     Status: None (Preliminary result)   Collection Time: 10/05/21 10:42 AM   Specimen: BLOOD RIGHT FOREARM  Result Value Ref Range Status   Specimen Description BLOOD RIGHT  FOREARM  Final   Special Requests   Final    BOTTLES DRAWN AEROBIC AND ANAEROBIC Blood Culture adequate volume   Culture   Final    NO GROWTH < 24 HOURS Performed at [facility], [address], [place], [place] [address]    Report Status PENDING  Incomplete  Urine Culture     Status: Abnormal   Collection Time: 10/05/21 12:47 PM   Specimen: Urine, Clean Catch  Result Value Ref Range Status   Specimen Description URINE, CLEAN CATCH  Final   Special Requests   Final    NONE Performed at [facility], [place] [address], [place], [place] [address]    Culture MULTIPLE SPECIES PRESENT, SUGGEST RECOLLECTION (A)  Final   Report Status 10/06/2021 FINAL  Final         Radiology Studies: CT ABDOMEN PELVIS WO CONTRAST  Result Date: 10/05/2021 CLINICAL DATA:  Abdominal pain EXAM: CT ABDOMEN AND PELVIS WITHOUT CONTRAST TECHNIQUE: Multidetector CT imaging of the abdomen and pelvis was performed following the standard protocol without IV contrast. RADIATION DOSE REDUCTION: This exam was performed according to the departmental dose-optimization program which includes automated exposure control, adjustment of the mA and/or kV according to  patient size and/or use of iterative reconstruction technique. COMPARISON:  Radiograph July 25, 2019. FINDINGS: Despite efforts by the technologist and patient, motion artifact is present on today's exam and could not be eliminated. This reduces exam sensitivity and specificity. Lower chest: Hypoventilatory change in the lung bases. Hepatobiliary: Hypodense 5 mm lesion in the right hepatic lobe on image 17/3 is technically too small to accurately characterize but statistically likely reflect a benign etiology such as a cyst or hemangioma. Gallbladder is unremarkable. No biliary ductal dilation. Pancreas: No pancreatic ductal dilation or evidence of acute inflammation. Spleen: Normal in size without focal abnormality. Adrenals/Urinary Tract: Bilateral adrenal glands appear normal. No hydronephrosis. No renal, ureteral or bladder calculi identified. Urinary bladder is unremarkable for degree of distension. Stomach/Bowel: No enteric contrast was administered. Stomach is unremarkable for degree of distension. No pathologic dilation of small or large bowel. The appendix and terminal ileum appear normal. No evidence of acute bowel inflammation. Vascular/Lymphatic: No significant vascular findings are present. No enlarged abdominal or pelvic lymph nodes. Reproductive: Prostate is unremarkable. Other: No significant abdominopelvic free fluid. Musculoskeletal: No acute or significant osseous findings. IMPRESSION: No acute abnormality in the abdomen or pelvis on this motion degraded examination. Electronically Signed   By: Dahlia Bailiff M.D.   On: 10/05/2021 11:49   CT HEAD WO CONTRAST (5MM)  Result Date: 10/05/2021 CLINICAL DATA:  Mental status change. EXAM: CT HEAD WITHOUT CONTRAST TECHNIQUE: Contiguous axial images were obtained from the base of the skull through the vertex without intravenous contrast. RADIATION DOSE REDUCTION: This exam was performed according to the departmental dose-optimization program which  includes automated exposure control, adjustment of the mA and/or kV according to patient size and/or use of iterative reconstruction technique. COMPARISON:  None. FINDINGS: Brain: No evidence of acute infarction, hemorrhage, hydrocephalus, extra-axial collection or mass lesion/mass effect. Vascular: Negative for hyperdense vessel Skull: Negative Sinuses/Orbits: Mucosal edema left maxillary sinus. Remaining sinuses clear. Negative orbit Other: None IMPRESSION: Negative CT head Electronically Signed   By: Franchot Gallo M.D.   On: 10/05/2021 11:46   DG Chest Portable 1 View  Result Date: 10/05/2021 CLINICAL DATA:  Altered mental status EXAM: PORTABLE CHEST 1 VIEW COMPARISON:  Chest x-ray dated July 28, 2019 FINDINGS: Cardiac and  mediastinal contours are unchanged. Lungs are clear. No evidence of pleural effusion or pneumothorax. IMPRESSION: No active disease. Electronically Signed   By: Yetta Glassman M.D.   On: 10/05/2021 10:23        Scheduled Meds:  aspirin EC  81 mg Oral Daily   atorvastatin  20 mg Oral QHS   clonazePAM  1 mg Oral TID   enoxaparin (LOVENOX) injection  40 mg Subcutaneous Q24H   hydrOXYzine  10 mg Oral BID   insulin aspart  0-5 Units Subcutaneous QHS   insulin aspart  0-6 Units Subcutaneous TID WC   metoprolol  200 mg Oral Daily   QUEtiapine  200 mg Oral BID   topiramate  100 mg Oral BID   Continuous Infusions:   LOS: 1 day    Time spent: 42 minutes spent on chart review, discussion with nursing staff, consultants, updating family and interview/physical exam; more than 50% of that time was spent in counseling and/or coordination of care.    Mena Simonis J [place] DO [facility] Available via Epic secure chat 7am-7pm After these hours, please refer to coverage provider listed on [email or web address] 10/06/2021, 2:51 PM

## 2021-10-06 NOTE — Plan of Care (Signed)
  Problem: Safety: Goal: Non-violent Restraint(s) Outcome: Not Applicable   

## 2021-10-06 NOTE — ED Notes (Signed)
Phlebotomy notified of need for labs drawn d/t difficult stick.  °

## 2021-10-06 NOTE — ED Notes (Addendum)
Pt's uncle (legal guardian) Alinda Money visited at this time. He was updated on patient's condition, plan of care, etc. Per Alinda Money, pt lives at [facility] group home and contact is Ms. Okey Dupre. Her phone number is [phone] and the number for the group home is [phone]. The plan is for him to return to group home upon discharge.

## 2021-10-06 NOTE — Progress Notes (Signed)
Patient refusing telemetry. Pushing staff away and was getting agitated. Will update MD.

## 2021-10-07 DIAGNOSIS — R451 Restlessness and agitation: Secondary | ICD-10-CM | POA: Diagnosis not present

## 2021-10-07 LAB — BLOOD CULTURE ID PANEL (REFLEXED) - BCID2

## 2021-10-07 LAB — AMMONIA: Ammonia: 27 umol/L (ref 9–35)

## 2021-10-07 LAB — HEMOGLOBIN A1C
Hgb A1c MFr Bld: 5.4 % (ref 4.8–5.6)
Mean Plasma Glucose: 108 mg/dL

## 2021-10-07 LAB — BASIC METABOLIC PANEL
Anion gap: 3 — ABNORMAL LOW (ref 5–15)
BUN: 26 mg/dL — ABNORMAL HIGH (ref 6–20)
CO2: 20 mmol/L — ABNORMAL LOW (ref 22–32)
Calcium: 8.7 mg/dL — ABNORMAL LOW (ref 8.9–10.3)
Chloride: 121 mmol/L — ABNORMAL HIGH (ref 98–111)
Creatinine, Ser: 2.7 mg/dL — ABNORMAL HIGH (ref 0.61–1.24)
GFR, Estimated: 29 mL/min — ABNORMAL LOW (ref >60)
Glucose, Bld: 107 mg/dL — ABNORMAL HIGH (ref 70–99)
Potassium: 4.5 mmol/L (ref 3.5–5.1)
Sodium: 144 mmol/L (ref 135–145)

## 2021-10-07 LAB — GLUCOSE, CAPILLARY
Glucose-Capillary: 98 mg/dL (ref 70–99)
Glucose-Capillary: 87 mg/dL (ref 70–99)
Glucose-Capillary: 94 mg/dL (ref 70–99)
Glucose-Capillary: 95 mg/dL (ref 70–99)

## 2021-10-07 LAB — LITHIUM LEVEL: Lithium Lvl: <0.06 mmol/L — ABNORMAL LOW (ref 0.60–1.20)

## 2021-10-07 LAB — TSH: TSH: 0.84 u[IU]/mL (ref 0.350–4.500)

## 2021-10-07 MED ORDER — LACTATED RINGERS IV SOLN: INTRAVENOUS | Status: DC

## 2021-10-07 NOTE — Plan of Care (Signed)
  Problem: Clinical Measurements: Goal: Will remain free from infection Outcome: Progressing Goal: Diagnostic test results will improve Outcome: Progressing   Problem: Coping: Goal: Level of anxiety will decrease Outcome: Progressing   

## 2021-10-07 NOTE — Progress Notes (Signed)
°  Transition of Care [facility]) Screening Note   Patient Details  Name: Casey Fernandez Date of Birth: Nov 03, 1975   Transition of Care [facility]) CM/SW Contact:    Tom-Johnson, Hershal Coria, RN Phone Number: 10/07/2021, 5:12 PM  Patient is from [facility]. Patient is Autistic and nonverbal at baseline. Admitted for Agitation. Patient was off of his lithium a few months ago and started on gabapentin 1 month ago.  Transition of Care Department [facility]) has reviewed patient and no recommendations noted at this time. We will continue to monitor patient advancement through interdisciplinary progression rounds. If new patient transition needs arise, please place a TOC consult.

## 2021-10-07 NOTE — Plan of Care (Signed)
Problem: Clinical Measurements: °Goal: Will remain free from infection °Outcome: Completed/Met °  °

## 2021-10-07 NOTE — Progress Notes (Signed)
Unable to finished History due to patient mentation.

## 2021-10-07 NOTE — Progress Notes (Signed)
PHARMACY - PHYSICIAN COMMUNICATION CRITICAL VALUE ALERT - BLOOD CULTURE IDENTIFICATION (BCID)  Casey Fernandez is an 46 y.o. male who presented to [facility] on 10/05/2021 with a chief complaint of agitation/AMS  Assessment:   1/2 blood cultures growing Staphylococcus species.  Pt with normal white count and has been afebrile during admission.  Likely contaminant  Name of physician (or Provider) Contacted:  Dr. Rachael Darby  Current antibiotics: None  Changes to prescribed antibiotics recommended:  No changes at this time   Results for orders placed or performed during the hospital encounter of 10/05/21  Blood Culture ID Panel (Reflexed) (Collected: 10/06/2021  8:40 AM)  Result Value Ref Range   Enterococcus faecalis NOT DETECTED NOT DETECTED   Enterococcus Faecium NOT DETECTED NOT DETECTED   Listeria monocytogenes NOT DETECTED NOT DETECTED   Staphylococcus species DETECTED (A) NOT DETECTED   Staphylococcus aureus (BCID) NOT DETECTED NOT DETECTED   Staphylococcus epidermidis NOT DETECTED NOT DETECTED   Staphylococcus lugdunensis NOT DETECTED NOT DETECTED   Streptococcus species NOT DETECTED NOT DETECTED   Streptococcus agalactiae NOT DETECTED NOT DETECTED   Streptococcus pneumoniae NOT DETECTED NOT DETECTED   Streptococcus pyogenes NOT DETECTED NOT DETECTED   A.calcoaceticus-baumannii NOT DETECTED NOT DETECTED   Bacteroides fragilis NOT DETECTED NOT DETECTED   Enterobacterales NOT DETECTED NOT DETECTED   Enterobacter cloacae complex NOT DETECTED NOT DETECTED   Escherichia coli NOT DETECTED NOT DETECTED   Klebsiella aerogenes NOT DETECTED NOT DETECTED   Klebsiella oxytoca NOT DETECTED NOT DETECTED   Klebsiella pneumoniae NOT DETECTED NOT DETECTED   Proteus species NOT DETECTED NOT DETECTED   Salmonella species NOT DETECTED NOT DETECTED   Serratia marcescens NOT DETECTED NOT DETECTED   Haemophilus influenzae NOT DETECTED NOT DETECTED   Neisseria meningitidis NOT DETECTED NOT  DETECTED   Pseudomonas aeruginosa NOT DETECTED NOT DETECTED   Stenotrophomonas maltophilia NOT DETECTED NOT DETECTED   Candida albicans NOT DETECTED NOT DETECTED   Candida auris NOT DETECTED NOT DETECTED   Candida glabrata NOT DETECTED NOT DETECTED   Candida krusei NOT DETECTED NOT DETECTED   Candida parapsilosis NOT DETECTED NOT DETECTED   Candida tropicalis NOT DETECTED NOT DETECTED   Cryptococcus neoformans/gattii NOT DETECTED NOT DETECTED    Eddie Candle 10/07/2021  6:17 AM

## 2021-10-07 NOTE — Progress Notes (Signed)
PROGRESS NOTE    Casey Fernandez  [id number] DOB: 24-May-1976 DOA: 10/05/2021 PCP: [facility]    Brief Narrative:  Casey Fernandez is a 46 year old male with past medical history significant for severe autism, nonverbal at baseline, anxiety/depression, type 2 diabetes mellitus, essential hypertension, dyslipidemia, OSA who presented to [facility] ED on 1/24 from his group home with agitation.  History provided by caregiver at bedside, usually patient is calm, eats his meals, takes his medication, ambulates at baseline.  Patient reportedly had an episode of vomiting on 1/22; since he has been eating and drinking less with appearance of being uncomfortable, agitated.  No further episodes of vomiting, no fever/chills/diarrhea, no cough/congestion, no shortness of breath noted.  Due to his symptoms he was brought to the ED for further evaluation.  Caregiver reports, patient was taken off of lithium a few months ago and started on gabapentin 1 month ago.  In the ED, temperature 98.1 F, HR 107, HR 93, RR 20, BP 143/104, SPO2 96% on room air.  Sodium 142, potassium 3.7, chloride 115, CO2 18, glucose 95, BUN 32, creatinine 3.01, lactic acid 1.1.  WBC 4.3, hemoglobin 10.2, platelets 190.  COVID-19 PCR negative.  Influenza A/B PCR negative.  Urinalysis unrevealing.  UDS negative.  CT head without contrast with no acute intracranial findings.  CT abdomen/pelvis without contrast with no acute abnormality in abdomen/pelvis.  EDP consulted [facility] for further evaluation and management of acute renal failure and agitation.   Assessment & Plan:   Principal Problem:   Agitation Active Problems:   AKI (acute kidney injury) ([place])   Essential hypertension   DM (diabetes mellitus) ([place])   Autism   Anxiety   Acute renal failure on CKD stage IIIb Patient presenting with 1 episode of nausea and in the setting of decreased oral intake.  Creatinine on admission noted to be elevated 3.01.  Baseline from 2020 appears  to be 1.75-2.0.  --Cr 3.01>2.84>2.60>2.70 --Continue IV fluid hydration with LR at 75 mL/h --Hold home losartan/furosemide --Avoid nephrotoxins, renally dose all medications --Repeat BMP in a.m.  Agitation Hx severe autism, nonverbal at baseline Depression Patient presenting to the ED with agitation, usually calm, takes his medication eats fairly consistently at baseline.  Patient is afebrile without leukocytosis.  Urinalysis unrevealing.  CT head without contrast with no acute intracranial abnormality.  CT abdomen/pelvis negative.  Ammonia level slightly elevated on admission to 45.  HIV nonreactive.  EtOH level less than 10.  UDS negative.  Lithium level less than 0.06, ammonia now down to 27 and TSH 0.840.  Also with acute on chronic renal failure in the setting of decreased oral intake.  Patient also recently stopped taking lithium about 3 months ago per caregiver. --Clonazepam 1 mg p.o. 3 times daily --Seroquel 20 mg p.o. twice daily --Topamax 100 mg p.o. twice daily --Hydroxyzine 10 mg p.o. twice daily. --Gabapentin at reduced dose 4 mg p.o. twice daily (on 600mg  TID outpatient) --Haldol 2 mg IV every 6 hours as needed agitation  Type 2 diabetes mellitus Hemoglobin A1c 7.5.  Home regimen includes glimepiride 2 mg p.o. daily. --Hold oral hypoglycemics while inpatient --SSI for coverage --CBGs qAC/HS  Essential hypertension --Continue metoprolol succinate 200 mg p.o. daily --Holding home losartan/furosemide --Monitor BP closely  Anemia Hemoglobin 11.1, stable.   DVT prophylaxis: enoxaparin (LOVENOX) injection 40 mg Start: 10/05/21 1400   Code Status: Full Code Family Communication: No family/caregiver present at bedside  Disposition Plan:  Level of care: Med-Surg Status is:  Inpatient  Remains inpatient appropriate because: IV fluid hydration, anticipate likely discharge back to group home tomorrow if renal function remained stable and oral intake increases     Consultants:  None  Procedures:  None  Antimicrobials:  None   Subjective: Patient seen examined at bedside, lying in bed.  Continues to mumble.  Trying to give me his urinal.  Discussed with RN, has been taking his medications, and eating/drinking appropriately.  No concerning agitation overnight.  No family/caregiver present at bedside this morning.  Unable to obtain any further ROS from the patient due to his underlying mental condition.  No acute concerns/events overnight per nursing staff.  Objective: Vitals:   10/06/21 1518 10/06/21 1854 10/06/21 2310 10/07/21 0228  BP: 138/87 (!) 132/96 128/71 133/83  Pulse: 77 72 70 75  Resp: 14 16 18 18   Temp: 98.1 F (36.7 C) 98.1 F (36.7 C) 98 F (36.7 C) 98 F (36.7 C)  TempSrc: Oral Oral Oral   SpO2: 98% 97% 98% 98%  Weight:      Height:        Intake/Output Summary (Last 24 hours) at 10/07/2021 0945 Last data filed at 10/07/2021 0800 Gross per 24 hour  Intake 4004.58 ml  Output 50 ml  Net 3954.58 ml   Filed Weights   10/05/21 1409  Weight: 103.9 kg    Examination:  General exam: Appears calm and comfortable; nonverbal mumbling Respiratory system: Clear to auscultation. Respiratory effort normal.  On room air Cardiovascular system: S1 & S2 heard, RRR. No JVD, murmurs, rubs, gallops or clicks. No pedal edema. Gastrointestinal system: Abdomen is nondistended, soft and nontender. No organomegaly or masses felt. Normal bowel sounds heard. Central nervous system: Alert, not oriented to person/place/time or situation.  Extremities: Symmetric 5 x 5 power. Skin: No rashes, lesions or ulcers Psychiatry: unable to assess further due to baseline autism and nonverbal at baseline    Data Reviewed: I have personally reviewed following labs and imaging studies  CBC: Recent Labs  Lab 10/05/21 1042 10/05/21 1055 10/05/21 1119 10/05/21 1358 10/06/21 0832  WBC 4.3  --   --  4.5 3.6*  NEUTROABS 2.4  --   --   --   --    HGB 10.2* 10.2* 10.2* 11.4* 11.1*  HCT 33.2* 30.0* 30.0* 35.9* 35.0*  MCV 96.2  --   --  96.0 95.6  PLT 190  --   --  137* [id number]   Basic Metabolic Panel: Recent Labs  Lab 10/05/21 1042 10/05/21 1055 10/05/21 1119 10/05/21 1358 10/06/21 0832 10/07/21 0323  NA 142 145 146*  --  140 144  K 3.7 3.7 3.7  --  4.1 4.5  CL 115*  --  116*  --  112* 121*  CO2 18*  --   --   --  21* 20*  GLUCOSE 95  --  79  --  104* 107*  BUN 32*  --  29*  --  28* 26*  CREATININE 3.01*  --  3.00* 2.84* 2.60* 2.70*  CALCIUM 8.7*  --   --   --  8.8* 8.7*   GFR: Estimated Creatinine Clearance: 42.8 mL/min (A) (by C-G formula based on SCr of 2.7 mg/dL (H)). Liver Function Tests: Recent Labs  Lab 10/05/21 1042 10/06/21 0832  AST 36 34  ALT 27 27  ALKPHOS 68 84  BILITOT 0.5 0.5  PROT 6.5 6.4*  ALBUMIN 3.3* 3.2*   No results for input(s): LIPASE, AMYLASE in the last 168  hours. Recent Labs  Lab 10/05/21 1042 10/07/21 0323  AMMONIA 45* 27   Coagulation Profile: No results for input(s): INR, PROTIME in the last 168 hours. Cardiac Enzymes: No results for input(s): CKTOTAL, CKMB, CKMBINDEX, TROPONINI in the last 168 hours. BNP (last 3 results) No results for input(s): PROBNP in the last 8760 hours. HbA1C: Recent Labs    10/06/21 0832  HGBA1C 5.4   CBG: Recent Labs  Lab 10/06/21 0802 10/06/21 1159 10/06/21 1709 10/06/21 2110 10/07/21 0727  GLUCAP 83 103* 100* 86 98   Lipid Profile: No results for input(s): CHOL, HDL, LDLCALC, TRIG, CHOLHDL, LDLDIRECT in the last 72 hours. Thyroid Function Tests: Recent Labs    10/07/21 0323  TSH 0.840   Anemia Panel: No results for input(s): VITAMINB12, FOLATE, FERRITIN, TIBC, IRON, RETICCTPCT in the last 72 hours. Sepsis Labs: Recent Labs  Lab 10/05/21 1042  LATICACIDVEN 1.1    Recent Results (from the past 240 hour(s))  Resp Panel by RT-PCR (Flu A&B, Covid)     Status: None   Collection Time: 10/05/21  9:21 AM   Specimen:  Nasopharyngeal(NP) swabs in vial transport medium  Result Value Ref Range Status   SARS Coronavirus 2 by RT PCR NEGATIVE NEGATIVE Final    Comment: (NOTE) SARS-CoV-2 target nucleic acids are NOT DETECTED.  The SARS-CoV-2 RNA is generally detectable in upper respiratory specimens during the acute phase of infection. The lowest concentration of SARS-CoV-2 viral copies this assay can detect is 138 copies/mL. A negative result does not preclude SARS-Cov-2 infection and should not be used as the sole basis for treatment or other patient management decisions. A negative result may occur with  improper specimen collection/handling, submission of specimen other than nasopharyngeal swab, presence of viral mutation(s) within the areas targeted by this assay, and inadequate number of viral copies(<138 copies/mL). A negative result must be combined with clinical observations, patient history, and epidemiological information. The expected result is Negative.  Fact Sheet for Patients:  EntrepreneurPulse.com.au  Fact Sheet for Healthcare Providers:  IncredibleEmployment.be  This test is no t yet approved or cleared by the [place] FDA and  has been authorized for detection and/or diagnosis of SARS-CoV-2 by FDA under an Emergency Use Authorization (EUA). This EUA will remain  in effect (meaning this test can be used) for the duration of the COVID-19 declaration under Section 564(b)(1) of the Act, 21 U.S.C.section 360bbb-3(b)(1), unless the authorization is terminated  or revoked sooner.       Influenza A by PCR NEGATIVE NEGATIVE Final   Influenza B by PCR NEGATIVE NEGATIVE Final    Comment: (NOTE) The Xpert Xpress SARS-CoV-2/FLU/RSV plus assay is intended as an aid in the diagnosis of influenza from Nasopharyngeal swab specimens and should not be used as a sole basis for treatment. Nasal washings and aspirates are unacceptable for Xpert Xpress  SARS-CoV-2/FLU/RSV testing.  Fact Sheet for Patients: EntrepreneurPulse.com.au  Fact Sheet for Healthcare Providers: IncredibleEmployment.be  This test is not yet approved or cleared by the [place] FDA and has been authorized for detection and/or diagnosis of SARS-CoV-2 by FDA under an Emergency Use Authorization (EUA). This EUA will remain in effect (meaning this test can be used) for the duration of the COVID-19 declaration under Section 564(b)(1) of the Act, 21 U.S.C. section 360bbb-3(b)(1), unless the authorization is terminated or revoked.  Performed at [facility], [place] [address] [place], [place] [address]   Blood Cultures (routine x 2)     Status: None (Preliminary  result)   Collection Time: 10/05/21 10:42 AM   Specimen: BLOOD RIGHT FOREARM  Result Value Ref Range Status   Specimen Description BLOOD RIGHT FOREARM  Final   Special Requests   Final    BOTTLES DRAWN AEROBIC AND ANAEROBIC Blood Culture adequate volume   Culture   Final    NO GROWTH 2 DAYS Performed at [facility], [address], [place], [place] [address]    Report Status PENDING  Incomplete  Urine Culture     Status: Abnormal   Collection Time: 10/05/21 12:47 PM   Specimen: Urine, Clean Catch  Result Value Ref Range Status   Specimen Description URINE, CLEAN CATCH  Final   Special Requests   Final    NONE Performed at [facility], [place] [address], [place], [place] [address]    Culture MULTIPLE SPECIES PRESENT, SUGGEST RECOLLECTION (A)  Final   Report Status 10/06/2021 FINAL  Final  Blood Cultures (routine x 2)     Status: None (Preliminary result)   Collection Time: 10/06/21  8:40 AM   Specimen: BLOOD LEFT HAND  Result Value Ref Range Status   Specimen Description BLOOD LEFT HAND  Final   Special Requests   Final    BOTTLES DRAWN AEROBIC AND ANAEROBIC Blood Culture results may not be optimal due to an inadequate volume of blood  received in culture bottles   Culture  Setup Time   Final    GRAM POSITIVE COCCI AEROBIC BOTTLE ONLY CRITICAL RESULT CALLED TO, READ BACK BY AND VERIFIED WITH: PHARMD GREG ABBOTT 10/07/21@6 :13 BY TW    Culture   Final    CULTURE REINCUBATED FOR BETTER GROWTH Performed at [facility], [place] [address] [place], [place] [address]    Report Status PENDING  Incomplete  Blood Culture ID Panel (Reflexed)     Status: Abnormal   Collection Time: 10/06/21  8:40 AM  Result Value Ref Range Status   Enterococcus faecalis NOT DETECTED NOT DETECTED Final   Enterococcus Faecium NOT DETECTED NOT DETECTED Final   Listeria monocytogenes NOT DETECTED NOT DETECTED Final   Staphylococcus species DETECTED (A) NOT DETECTED Final    Comment: CRITICAL RESULT CALLED TO, READ BACK BY AND VERIFIED WITH: PHARMD GREG ABBOTT 10/07/21@6 :13 BY TW    Staphylococcus aureus (BCID) NOT DETECTED NOT DETECTED Final   Staphylococcus epidermidis NOT DETECTED NOT DETECTED Final   Staphylococcus lugdunensis NOT DETECTED NOT DETECTED Final   Streptococcus species NOT DETECTED NOT DETECTED Final   Streptococcus agalactiae NOT DETECTED NOT DETECTED Final   Streptococcus pneumoniae NOT DETECTED NOT DETECTED Final   Streptococcus pyogenes NOT DETECTED NOT DETECTED Final   A.calcoaceticus-baumannii NOT DETECTED NOT DETECTED Final   Bacteroides fragilis NOT DETECTED NOT DETECTED Final   Enterobacterales NOT DETECTED NOT DETECTED Final   Enterobacter cloacae complex NOT DETECTED NOT DETECTED Final   Escherichia coli NOT DETECTED NOT DETECTED Final   Klebsiella aerogenes NOT DETECTED NOT DETECTED Final   Klebsiella oxytoca NOT DETECTED NOT DETECTED Final   Klebsiella pneumoniae NOT DETECTED NOT DETECTED Final   Proteus species NOT DETECTED NOT DETECTED Final   Salmonella species NOT DETECTED NOT DETECTED Final   Serratia marcescens NOT DETECTED NOT DETECTED Final   Haemophilus influenzae NOT DETECTED NOT DETECTED Final    Neisseria meningitidis NOT DETECTED NOT DETECTED Final   Pseudomonas aeruginosa NOT DETECTED NOT DETECTED Final   Stenotrophomonas maltophilia NOT DETECTED NOT DETECTED Final   Candida albicans NOT DETECTED NOT DETECTED  Final   Candida auris NOT DETECTED NOT DETECTED Final   Candida glabrata NOT DETECTED NOT DETECTED Final   Candida krusei NOT DETECTED NOT DETECTED Final   Candida parapsilosis NOT DETECTED NOT DETECTED Final   Candida tropicalis NOT DETECTED NOT DETECTED Final   Cryptococcus neoformans/gattii NOT DETECTED NOT DETECTED Final    Comment: Performed at [facility], [place] [address] [place], [place] [address]         Radiology Studies: CT ABDOMEN PELVIS WO CONTRAST  Result Date: 10/05/2021 CLINICAL DATA:  Abdominal pain EXAM: CT ABDOMEN AND PELVIS WITHOUT CONTRAST TECHNIQUE: Multidetector CT imaging of the abdomen and pelvis was performed following the standard protocol without IV contrast. RADIATION DOSE REDUCTION: This exam was performed according to the departmental dose-optimization program which includes automated exposure control, adjustment of the mA and/or kV according to patient size and/or use of iterative reconstruction technique. COMPARISON:  Radiograph July 25, 2019. FINDINGS: Despite efforts by the technologist and patient, motion artifact is present on today's exam and could not be eliminated. This reduces exam sensitivity and specificity. Lower chest: Hypoventilatory change in the lung bases. Hepatobiliary: Hypodense 5 mm lesion in the right hepatic lobe on image 17/3 is technically too small to accurately characterize but statistically likely reflect a benign etiology such as a cyst or hemangioma. Gallbladder is unremarkable. No biliary ductal dilation. Pancreas: No pancreatic ductal dilation or evidence of acute inflammation. Spleen: Normal in size without focal abnormality. Adrenals/Urinary Tract: Bilateral adrenal glands appear normal. No hydronephrosis.  No renal, ureteral or bladder calculi identified. Urinary bladder is unremarkable for degree of distension. Stomach/Bowel: No enteric contrast was administered. Stomach is unremarkable for degree of distension. No pathologic dilation of small or large bowel. The appendix and terminal ileum appear normal. No evidence of acute bowel inflammation. Vascular/Lymphatic: No significant vascular findings are present. No enlarged abdominal or pelvic lymph nodes. Reproductive: Prostate is unremarkable. Other: No significant abdominopelvic free fluid. Musculoskeletal: No acute or significant osseous findings. IMPRESSION: No acute abnormality in the abdomen or pelvis on this motion degraded examination. Electronically Signed   By: Dahlia Bailiff M.D.   On: 10/05/2021 11:49   CT HEAD WO CONTRAST (5MM)  Result Date: 10/05/2021 CLINICAL DATA:  Mental status change. EXAM: CT HEAD WITHOUT CONTRAST TECHNIQUE: Contiguous axial images were obtained from the base of the skull through the vertex without intravenous contrast. RADIATION DOSE REDUCTION: This exam was performed according to the departmental dose-optimization program which includes automated exposure control, adjustment of the mA and/or kV according to patient size and/or use of iterative reconstruction technique. COMPARISON:  None. FINDINGS: Brain: No evidence of acute infarction, hemorrhage, hydrocephalus, extra-axial collection or mass lesion/mass effect. Vascular: Negative for hyperdense vessel Skull: Negative Sinuses/Orbits: Mucosal edema left maxillary sinus. Remaining sinuses clear. Negative orbit Other: None IMPRESSION: Negative CT head Electronically Signed   By: Franchot Gallo M.D.   On: 10/05/2021 11:46   DG Chest Portable 1 View  Result Date: 10/05/2021 CLINICAL DATA:  Altered mental status EXAM: PORTABLE CHEST 1 VIEW COMPARISON:  Chest x-ray dated July 28, 2019 FINDINGS: Cardiac and mediastinal contours are unchanged. Lungs are clear. No evidence of  pleural effusion or pneumothorax. IMPRESSION: No active disease. Electronically Signed   By: Yetta Glassman M.D.   On: 10/05/2021 10:23        Scheduled Meds:  aspirin EC  81 mg Oral Daily   atorvastatin  20 mg Oral QHS   clonazePAM  1 mg Oral TID  enoxaparin (LOVENOX) injection  40 mg Subcutaneous Q24H   gabapentin  400 mg Oral BID   hydrOXYzine  10 mg Oral BID   insulin aspart  0-5 Units Subcutaneous QHS   insulin aspart  0-6 Units Subcutaneous TID WC   metoprolol  200 mg Oral Daily   QUEtiapine  200 mg Oral BID   topiramate  100 mg Oral BID   Continuous Infusions:  sodium chloride 100 mL/hr at 10/07/21 0755     LOS: 2 days    Time spent: 39 minutes spent on chart review, discussion with nursing staff, consultants, updating family and interview/physical exam; more than 50% of that time was spent in counseling and/or coordination of care.    Emanie Behan J [place] DO [facility] Available via Epic secure chat 7am-7pm After these hours, please refer to coverage provider listed on [email or web address] 10/07/2021, 9:45 AM

## 2021-10-08 DIAGNOSIS — R451 Restlessness and agitation: Secondary | ICD-10-CM | POA: Diagnosis not present

## 2021-10-08 LAB — BASIC METABOLIC PANEL
Anion gap: 6 (ref 5–15)
BUN: 27 mg/dL — ABNORMAL HIGH (ref 6–20)
CO2: 18 mmol/L — ABNORMAL LOW (ref 22–32)
Calcium: 8.4 mg/dL — ABNORMAL LOW (ref 8.9–10.3)
Chloride: 121 mmol/L — ABNORMAL HIGH (ref 98–111)
Creatinine, Ser: 2.34 mg/dL — ABNORMAL HIGH (ref 0.61–1.24)
GFR, Estimated: 34 mL/min — ABNORMAL LOW (ref >60)
Glucose, Bld: 109 mg/dL — ABNORMAL HIGH (ref 70–99)
Potassium: 5.2 mmol/L — ABNORMAL HIGH (ref 3.5–5.1)
Sodium: 145 mmol/L (ref 135–145)

## 2021-10-08 LAB — CULTURE, BLOOD (ROUTINE X 2)

## 2021-10-08 LAB — GLUCOSE, CAPILLARY: Glucose-Capillary: 72 mg/dL (ref 70–99)

## 2021-10-08 MED ORDER — QUETIAPINE FUMARATE ER 200 MG PO TB24: 200.0000 mg | ORAL_TABLET | Freq: Two times a day (BID) | ORAL | 2 refills | Status: AC

## 2021-10-08 MED ORDER — GABAPENTIN 400 MG PO CAPS: 400.0000 mg | ORAL_CAPSULE | Freq: Two times a day (BID) | ORAL | 2 refills | Status: AC

## 2021-10-08 MED ORDER — HYDROXYZINE HCL 10 MG PO TABS: 10.0000 mg | ORAL_TABLET | Freq: Two times a day (BID) | ORAL | 2 refills | Status: AC

## 2021-10-08 NOTE — Care Management Important Message (Signed)
Important Message  Patient Details  Name: Casey Fernandez MRN: [id number] Date of Birth: 1976/04/11   Medicare Important Message Given:  Yes     Rex Oesterle 10/08/2021, 2:26 PM

## 2021-10-08 NOTE — TOC Progression Note (Addendum)
Transition of Care [facility]) - Initial/Assessment Note    Patient Details  Name: Casey Fernandez MRN: [id number] Date of Birth: [birth year]  Transition of Care [facility]) CM/SW Contact:    Ralene Bathe, LCSWA Phone Number: 10/08/2021, 10:43 AM  Clinical Narrative:                 10:15-  CSW attempted to contact [facility] [phone]) and inform the agency that the patient is medically ready to return.  There was no answer.  CSW left a VM requesting a returned call.  10:20-CSW attempted to call the patient's uncle and legal guardian.  There was no answer.  CSW left a VM requesting a returned call.  10:22-  CSW called the phone number listed as "home" in the patient's chart.  This was a fax number.    14:00-  CSW received a call from the the facility [phone]).  The facility will send someone out to transport the patient by 15:30.  RN notified.     Patient Goals and CMS Choice        Expected Discharge Plan and Services           Expected Discharge Date: 10/08/21                                    Prior Living Arrangements/Services                       Activities of Daily Living      Permission Sought/Granted                  Emotional Assessment              Admission diagnosis:  Agitation [R45.1] AKI (acute kidney injury) [facility]) [N17.9] Patient Active Problem List   Diagnosis Date Noted   Agitation 10/05/2021   Hypernatremia 07/30/2019   COVID-19 07/26/2019   Acute respiratory failure with hypoxia (HCC) 07/25/2019   Pneumonia due to COVID-19 virus 07/25/2019   AKI (acute kidney injury) (HCC) 07/25/2019   Essential hypertension 07/25/2019   DM (diabetes mellitus) (HCC) 07/25/2019   HLD (hyperlipidemia) 07/25/2019   Sleep apnea 07/25/2019   Autism 07/25/2019   Anxiety 07/25/2019   PCP:  [facility]:   [facility], [place] - [address] [place] [address] Phone: [phone]  Fax: [phone]     Social Determinants of Health (SDOH) Interventions    Readmission Risk Interventions No flowsheet data found.

## 2021-10-08 NOTE — Discharge Summary (Signed)
Physician Discharge Summary  Casey Fernandez [id number] DOB: 03-26-1976 DOA: 10/05/2021  PCP: [facility]  Admit date: 10/05/2021 Discharge date: 10/08/2021  Admitted From: [facility] Disposition:  [facility]  Recommendations for Outpatient Follow-up:  Follow up with PCP in 1-2 weeks Recommend outpatient follow-up with behavioral health specialist Recommend outpatient referral to nephrology to follow chronic kidney disease Seroquel 200 mg p.o. twice daily, hydroxyzine 10 mg p.o. twice daily Reduce gabapentin to 400 mg p.o. twice daily due to renal function Please obtain BMP in one week to assess renal function  Home Health: No Equipment/Devices: None  Discharge Condition: Stable CODE STATUS: Full code Diet recommendation: Consistent carbohydrate diet  History of present illness:  Casey Fernandez is a 46 year old male with past medical history significant for severe autism, nonverbal at baseline, anxiety/depression, type 2 diabetes mellitus, essential hypertension, dyslipidemia, OSA who presented to [facility] ED on 1/24 from his group home with agitation.  History provided by caregiver at bedside, usually patient is calm, eats his meals, takes his medication, ambulates at baseline.  Patient reportedly had an episode of vomiting on 1/22; since he has been eating and drinking less with appearance of being uncomfortable, agitated.  No further episodes of vomiting, no fever/chills/diarrhea, no cough/congestion, no shortness of breath noted.  Due to his symptoms he was brought to the ED for further evaluation.   Caregiver reports, patient was taken off of lithium a few months ago and started on gabapentin 1 month ago.   In the ED, temperature 98.1 F, HR 107, HR 93, RR 20, BP 143/104, SPO2 96% on room air.  Sodium 142, potassium 3.7, chloride 115, CO2 18, glucose 95, BUN 32, creatinine 3.01, lactic acid 1.1.  WBC 4.3, hemoglobin 10.2, platelets 190.  COVID-19 PCR  negative.  Influenza A/B PCR negative.  Urinalysis unrevealing.  UDS negative.  CT head without contrast with no acute intracranial findings.  CT abdomen/pelvis without contrast with no acute abnormality in abdomen/pelvis.  EDP consulted [facility] for further evaluation and management of acute renal failure and agitation.  Hospital course:  Acute renal failure on CKD stage IIIb Patient presenting with 1 episode of nausea and in the setting of decreased oral intake.  Creatinine on admission noted to be elevated 3.01.  Baseline from 2020 appears to be 1.75-2.0; but may be even higher now given no labs in EMR since 2020.  Patient was started on IV fluid hydration with LR with improvement of creatinine from 3.01 to 2.34 at time of discharge.  Discontinued home losartan.  May use furosemide as needed, but recommend to monitor daily weights to see if needed.  Recommend outpatient referral to nephrology for further monitoring of his chronic kidney disease.   Agitation Hx severe autism, nonverbal at baseline Depression Patient presenting to the ED with agitation, usually calm, takes his medication eats fairly consistently at baseline.  Patient is afebrile without leukocytosis.  Urinalysis unrevealing.  CT head without contrast with no acute intracranial abnormality.  CT abdomen/pelvis negative.  Ammonia level slightly elevated on admission to 45.  HIV nonreactive.  EtOH level less than 10.  UDS negative.  Lithium level less than 0.06, ammonia now down to 27 and TSH 0.840.  Also with acute on chronic renal failure in the setting of decreased oral intake.  Patient also recently stopped taking lithium about 3 months ago per caregiver. Clonazepam 1 mg p.o. 3 times daily, Seroquel 200 mg p.o. twice daily, Topamax 100 mg p.o.  twice daily, Hydroxyzine 10 mg p.o. twice daily; Gabapentin at reduced dose 400 mg p.o. twice daily based on renal function.  Recommend outpatient follow-up with his behavioral health specialist for  further titration of regimen.     Type 2 diabetes mellitus Hemoglobin A1c 7.5.  Home regimen includes glimepiride 2 mg p.o. daily.   Essential hypertension Continue metoprolol succinate 200 mg p.o. daily.  Discontinued losartan due to renal function.  Continue furosemide as needed.  Monitor weights closely.   Anemia Hemoglobin 11.1, stable.   Discharge Diagnoses:  Principal Problem:   Agitation Active Problems:   AKI (acute kidney injury) ([place])   Essential hypertension   DM (diabetes mellitus) ([place])   Autism   Anxiety    Discharge Instructions  Discharge Instructions     Diet - low sodium heart healthy   Complete by: As directed    Increase activity slowly   Complete by: As directed       Allergies as of 10/08/2021   No Known Allergies      Medication List     STOP taking these medications    gabapentin 600 MG tablet Commonly known as: NEURONTIN Replaced by: gabapentin 400 MG capsule   losartan 25 MG tablet Commonly known as: COZAAR       TAKE these medications    aspirin EC 81 MG tablet Take 81 mg by mouth daily.   atorvastatin 20 MG tablet Commonly known as: LIPITOR Take 20 mg by mouth at bedtime.   clonazePAM 1 MG tablet Commonly known as: KLONOPIN Take 1 mg by mouth 3 (three) times daily. 0800, 1200, 1900   furosemide 20 MG tablet Commonly known as: LASIX Take 20 mg by mouth daily as needed (leg swelling).   gabapentin 400 MG capsule Commonly known as: NEURONTIN Take 1 capsule (400 mg total) by mouth 2 (two) times daily. Replaces: gabapentin 600 MG tablet   glimepiride 2 MG tablet Commonly known as: AMARYL Take 2 mg by mouth every morning.   hydrOXYzine 10 MG tablet Commonly known as: ATARAX Take 1 tablet (10 mg total) by mouth 2 (two) times daily.   Melatonin 5 MG Subl Place 10 mg under the tongue at bedtime.   metoprolol 200 MG 24 hr tablet Commonly known as: TOPROL-XL Take 200 mg by mouth daily.   oxybutynin 5 MG  tablet Commonly known as: DITROPAN Take 5 mg by mouth 2 (two) times daily.   polyethylene glycol powder 17 GM/SCOOP powder Commonly known as: GLYCOLAX/MIRALAX Take 17 g by mouth daily.   QUEtiapine 200 MG 24 hr tablet Commonly known as: SEROQUEL XR Take 1 tablet (200 mg total) by mouth 2 (two) times daily. What changed:  medication strength how much to take when to take this   topiramate 100 MG tablet Commonly known as: TOPAMAX Take 100 mg by mouth 2 (two) times daily.   Vitamin D (Ergocalciferol) 1.25 MG (50000 UNIT) Caps capsule Commonly known as: DRISDOL Take 50,000 Units by mouth once a week. Takes on Saturdays.        Follow-up Information     [facility]. Schedule an appointment as soon as possible for a visit in 1 week(s).   Specialty: Internal Medicine Contact information: [place] [address] [phone]                No Known Allergies  Consultations: none   Procedures/Studies: CT ABDOMEN PELVIS WO CONTRAST  Result Date: 10/05/2021 CLINICAL DATA:  Abdominal pain EXAM:  CT ABDOMEN AND PELVIS WITHOUT CONTRAST TECHNIQUE: Multidetector CT imaging of the abdomen and pelvis was performed following the standard protocol without IV contrast. RADIATION DOSE REDUCTION: This exam was performed according to the departmental dose-optimization program which includes automated exposure control, adjustment of the mA and/or kV according to patient size and/or use of iterative reconstruction technique. COMPARISON:  Radiograph July 25, 2019. FINDINGS: Despite efforts by the technologist and patient, motion artifact is present on today's exam and could not be eliminated. This reduces exam sensitivity and specificity. Lower chest: Hypoventilatory change in the lung bases. Hepatobiliary: Hypodense 5 mm lesion in the right hepatic lobe on image 17/3 is technically too small to accurately characterize but statistically likely reflect a benign  etiology such as a cyst or hemangioma. Gallbladder is unremarkable. No biliary ductal dilation. Pancreas: No pancreatic ductal dilation or evidence of acute inflammation. Spleen: Normal in size without focal abnormality. Adrenals/Urinary Tract: Bilateral adrenal glands appear normal. No hydronephrosis. No renal, ureteral or bladder calculi identified. Urinary bladder is unremarkable for degree of distension. Stomach/Bowel: No enteric contrast was administered. Stomach is unremarkable for degree of distension. No pathologic dilation of small or large bowel. The appendix and terminal ileum appear normal. No evidence of acute bowel inflammation. Vascular/Lymphatic: No significant vascular findings are present. No enlarged abdominal or pelvic lymph nodes. Reproductive: Prostate is unremarkable. Other: No significant abdominopelvic free fluid. Musculoskeletal: No acute or significant osseous findings. IMPRESSION: No acute abnormality in the abdomen or pelvis on this motion degraded examination. Electronically Signed   By: Dahlia Bailiff M.D.   On: 10/05/2021 11:49   CT HEAD WO CONTRAST (5MM)  Result Date: 10/05/2021 CLINICAL DATA:  Mental status change. EXAM: CT HEAD WITHOUT CONTRAST TECHNIQUE: Contiguous axial images were obtained from the base of the skull through the vertex without intravenous contrast. RADIATION DOSE REDUCTION: This exam was performed according to the departmental dose-optimization program which includes automated exposure control, adjustment of the mA and/or kV according to patient size and/or use of iterative reconstruction technique. COMPARISON:  None. FINDINGS: Brain: No evidence of acute infarction, hemorrhage, hydrocephalus, extra-axial collection or mass lesion/mass effect. Vascular: Negative for hyperdense vessel Skull: Negative Sinuses/Orbits: Mucosal edema left maxillary sinus. Remaining sinuses clear. Negative orbit Other: None IMPRESSION: Negative CT head Electronically Signed   By:  Franchot Gallo M.D.   On: 10/05/2021 11:46   DG Chest Portable 1 View  Result Date: 10/05/2021 CLINICAL DATA:  Altered mental status EXAM: PORTABLE CHEST 1 VIEW COMPARISON:  Chest x-ray dated July 28, 2019 FINDINGS: Cardiac and mediastinal contours are unchanged. Lungs are clear. No evidence of pleural effusion or pneumothorax. IMPRESSION: No active disease. Electronically Signed   By: Yetta Glassman M.D.   On: 10/05/2021 10:23     Subjective: Patient seen examined at bedside, resting comfortably.  Mumbling.  Continues to take his medications and eat his meals in entirety.  Appears to be at his baseline.  Creatinine improved.  Ready for discharge back to his group home.  Unable to obtain any further ROS from patient this morning due to his underlying autism and nonverbal at baseline.  Discharge Exam: Vitals:   10/07/21 2044 10/08/21 0650  BP: 123/72 (!) 154/94  Pulse: 80 69  Resp: 18 18  Temp: 98.8 F (37.1 C) 98 F (36.7 C)  SpO2: 100% 100%   Vitals:   10/07/21 1019 10/07/21 1711 10/07/21 2044 10/08/21 0650  BP: (!) 158/92 (!) 154/89 123/72 (!) 154/94  Pulse: 89 71 80 69  Resp: 18 18 18 18   Temp:   98.8 F (37.1 C) 98 F (36.7 C)  TempSrc:    Oral  SpO2:  100% 100% 100%  Weight:      Height:        GEN: NAD, mumbling HEENT: NCAT, PERRL, EOMI, sclera clear, MMM PULM: CTAB w/o wheezes/crackles, normal respiratory effort, on room air CV: RRR w/o M/G/R GI: abd soft, NTND, NABS MSK: no peripheral edema, muscle strength globally intact 5/5 bilateral upper/lower extremities NEURO: CN II-XII intact, no focal deficits, moving all extremities independently PSYCH: normal mood/affect Integumentary: dry/intact, no rashes or wounds    The results of significant diagnostics from this hospitalization (including imaging, microbiology, ancillary and laboratory) are listed below for reference.     Microbiology: Recent Results (from the past 240 hour(s))  Resp Panel by RT-PCR  (Flu A&B, Covid)     Status: None   Collection Time: 10/05/21  9:21 AM   Specimen: Nasopharyngeal(NP) swabs in vial transport medium  Result Value Ref Range Status   SARS Coronavirus 2 by RT PCR NEGATIVE NEGATIVE Final    Comment: (NOTE) SARS-CoV-2 target nucleic acids are NOT DETECTED.  The SARS-CoV-2 RNA is generally detectable in upper respiratory specimens during the acute phase of infection. The lowest concentration of SARS-CoV-2 viral copies this assay can detect is 138 copies/mL. A negative result does not preclude SARS-Cov-2 infection and should not be used as the sole basis for treatment or other patient management decisions. A negative result may occur with  improper specimen collection/handling, submission of specimen other than nasopharyngeal swab, presence of viral mutation(s) within the areas targeted by this assay, and inadequate number of viral copies(<138 copies/mL). A negative result must be combined with clinical observations, patient history, and epidemiological information. The expected result is Negative.  Fact Sheet for Patients:  EntrepreneurPulse.com.au  Fact Sheet for Healthcare Providers:  IncredibleEmployment.be  This test is no t yet approved or cleared by the [place] FDA and  has been authorized for detection and/or diagnosis of SARS-CoV-2 by FDA under an Emergency Use Authorization (EUA). This EUA will remain  in effect (meaning this test can be used) for the duration of the COVID-19 declaration under Section 564(b)(1) of the Act, 21 U.S.C.section 360bbb-3(b)(1), unless the authorization is terminated  or revoked sooner.       Influenza A by PCR NEGATIVE NEGATIVE Final   Influenza B by PCR NEGATIVE NEGATIVE Final    Comment: (NOTE) The Xpert Xpress SARS-CoV-2/FLU/RSV plus assay is intended as an aid in the diagnosis of influenza from Nasopharyngeal swab specimens and should not be used as a sole basis  for treatment. Nasal washings and aspirates are unacceptable for Xpert Xpress SARS-CoV-2/FLU/RSV testing.  Fact Sheet for Patients: EntrepreneurPulse.com.au  Fact Sheet for Healthcare Providers: IncredibleEmployment.be  This test is not yet approved or cleared by the [place] FDA and has been authorized for detection and/or diagnosis of SARS-CoV-2 by FDA under an Emergency Use Authorization (EUA). This EUA will remain in effect (meaning this test can be used) for the duration of the COVID-19 declaration under Section 564(b)(1) of the Act, 21 U.S.C. section 360bbb-3(b)(1), unless the authorization is terminated or revoked.  Performed at [facility], [place] [address], [place], [place] [address]   Blood Cultures (routine x 2)     Status: None (Preliminary result)   Collection Time: 10/05/21 10:42 AM   Specimen: BLOOD RIGHT FOREARM  Result Value Ref Range Status   Specimen Description BLOOD  RIGHT FOREARM  Final   Special Requests   Final    BOTTLES DRAWN AEROBIC AND ANAEROBIC Blood Culture adequate volume   Culture   Final    NO GROWTH 3 DAYS Performed at [facility], [address], [place], [place] [address]    Report Status PENDING  Incomplete  Urine Culture     Status: Abnormal   Collection Time: 10/05/21 12:47 PM   Specimen: Urine, Clean Catch  Result Value Ref Range Status   Specimen Description URINE, CLEAN CATCH  Final   Special Requests   Final    NONE Performed at [facility], [place] [address] [place], [place] [address]    Culture MULTIPLE SPECIES PRESENT, SUGGEST RECOLLECTION (A)  Final   Report Status 10/06/2021 FINAL  Final  Blood Cultures (routine x 2)     Status: None (Preliminary result)   Collection Time: 10/06/21  8:40 AM   Specimen: BLOOD LEFT HAND  Result Value Ref Range Status   Specimen Description BLOOD LEFT HAND  Final   Special Requests   Final    BOTTLES DRAWN AEROBIC AND ANAEROBIC Blood  Culture results may not be optimal due to an inadequate volume of blood received in culture bottles   Culture  Setup Time   Final    GRAM POSITIVE COCCI AEROBIC BOTTLE ONLY CRITICAL RESULT CALLED TO, READ BACK BY AND VERIFIED WITH: PHARMD GREG ABBOTT 10/07/21@6 :13 BY TW Performed at [facility], [place] [address] [place], [place] [address]    Culture GRAM POSITIVE COCCI  Final   Report Status PENDING  Incomplete  Blood Culture ID Panel (Reflexed)     Status: Abnormal   Collection Time: 10/06/21  8:40 AM  Result Value Ref Range Status   Enterococcus faecalis NOT DETECTED NOT DETECTED Final   Enterococcus Faecium NOT DETECTED NOT DETECTED Final   Listeria monocytogenes NOT DETECTED NOT DETECTED Final   Staphylococcus species DETECTED (A) NOT DETECTED Final    Comment: CRITICAL RESULT CALLED TO, READ BACK BY AND VERIFIED WITH: PHARMD GREG ABBOTT 10/07/21@6 :13 BY TW    Staphylococcus aureus (BCID) NOT DETECTED NOT DETECTED Final   Staphylococcus epidermidis NOT DETECTED NOT DETECTED Final   Staphylococcus lugdunensis NOT DETECTED NOT DETECTED Final   Streptococcus species NOT DETECTED NOT DETECTED Final   Streptococcus agalactiae NOT DETECTED NOT DETECTED Final   Streptococcus pneumoniae NOT DETECTED NOT DETECTED Final   Streptococcus pyogenes NOT DETECTED NOT DETECTED Final   A.calcoaceticus-baumannii NOT DETECTED NOT DETECTED Final   Bacteroides fragilis NOT DETECTED NOT DETECTED Final   Enterobacterales NOT DETECTED NOT DETECTED Final   Enterobacter cloacae complex NOT DETECTED NOT DETECTED Final   Escherichia coli NOT DETECTED NOT DETECTED Final   Klebsiella aerogenes NOT DETECTED NOT DETECTED Final   Klebsiella oxytoca NOT DETECTED NOT DETECTED Final   Klebsiella pneumoniae NOT DETECTED NOT DETECTED Final   Proteus species NOT DETECTED NOT DETECTED Final   Salmonella species NOT DETECTED NOT DETECTED Final   Serratia marcescens NOT DETECTED NOT DETECTED Final   Haemophilus  influenzae NOT DETECTED NOT DETECTED Final   Neisseria meningitidis NOT DETECTED NOT DETECTED Final   Pseudomonas aeruginosa NOT DETECTED NOT DETECTED Final   Stenotrophomonas maltophilia NOT DETECTED NOT DETECTED Final   Candida albicans NOT DETECTED NOT DETECTED Final   Candida auris NOT DETECTED NOT DETECTED Final   Candida glabrata NOT DETECTED NOT DETECTED Final   Candida krusei NOT DETECTED NOT DETECTED Final   Candida parapsilosis  NOT DETECTED NOT DETECTED Final   Candida tropicalis NOT DETECTED NOT DETECTED Final   Cryptococcus neoformans/gattii NOT DETECTED NOT DETECTED Final    Comment: Performed at [facility], [place] [address], [place], [place] [address]     Labs: BNP (last 3 results) No results for input(s): BNP in the last 8760 hours. Basic Metabolic Panel: Recent Labs  Lab 10/05/21 1042 10/05/21 1055 10/05/21 1119 10/05/21 1358 10/06/21 0832 10/07/21 0323 10/08/21 0336  NA 142 145 146*  --  140 144 145  K 3.7 3.7 3.7  --  4.1 4.5 5.2*  CL 115*  --  116*  --  112* 121* 121*  CO2 18*  --   --   --  21* 20* 18*  GLUCOSE 95  --  79  --  104* 107* 109*  BUN 32*  --  29*  --  28* 26* 27*  CREATININE 3.01*  --  3.00* 2.84* 2.60* 2.70* 2.34*  CALCIUM 8.7*  --   --   --  8.8* 8.7* 8.4*   Liver Function Tests: Recent Labs  Lab 10/05/21 1042 10/06/21 0832  AST 36 34  ALT 27 27  ALKPHOS 68 84  BILITOT 0.5 0.5  PROT 6.5 6.4*  ALBUMIN 3.3* 3.2*   No results for input(s): LIPASE, AMYLASE in the last 168 hours. Recent Labs  Lab 10/05/21 1042 10/07/21 0323  AMMONIA 45* 27   CBC: Recent Labs  Lab 10/05/21 1042 10/05/21 1055 10/05/21 1119 10/05/21 1358 10/06/21 0832  WBC 4.3  --   --  4.5 3.6*  NEUTROABS 2.4  --   --   --   --   HGB 10.2* 10.2* 10.2* 11.4* 11.1*  HCT 33.2* 30.0* 30.0* 35.9* 35.0*  MCV 96.2  --   --  96.0 95.6  PLT 190  --   --  137* 183   Cardiac Enzymes: No results for input(s): CKTOTAL, CKMB, CKMBINDEX, TROPONINI in the last  168 hours. BNP: Invalid input(s): POCBNP CBG: Recent Labs  Lab 10/07/21 0727 10/07/21 1137 10/07/21 1633 10/07/21 2043 10/08/21 0713  GLUCAP 98 87 94 95 72   D-Dimer No results for input(s): DDIMER in the last 72 hours. Hgb A1c Recent Labs    10/06/21 0832  HGBA1C 5.4   Lipid Profile No results for input(s): CHOL, HDL, LDLCALC, TRIG, CHOLHDL, LDLDIRECT in the last 72 hours. Thyroid function studies Recent Labs    10/07/21 0323  TSH 0.840   Anemia work up No results for input(s): VITAMINB12, FOLATE, FERRITIN, TIBC, IRON, RETICCTPCT in the last 72 hours. Urinalysis    Component Value Date/Time   COLORURINE STRAW (A) 10/05/2021 0919   APPEARANCEUR CLEAR 10/05/2021 0919   LABSPEC 1.006 10/05/2021 0919   PHURINE 7.0 10/05/2021 0919   GLUCOSEU NEGATIVE 10/05/2021 0919   HGBUR NEGATIVE 10/05/2021 0919   BILIRUBINUR NEGATIVE 10/05/2021 0919   KETONESUR NEGATIVE 10/05/2021 0919   PROTEINUR NEGATIVE 10/05/2021 0919   NITRITE NEGATIVE 10/05/2021 0919   LEUKOCYTESUR NEGATIVE 10/05/2021 0919   Sepsis Labs Invalid input(s): PROCALCITONIN,  WBC,  LACTICIDVEN Microbiology Recent Results (from the past 240 hour(s))  Resp Panel by RT-PCR (Flu A&B, Covid)     Status: None   Collection Time: 10/05/21  9:21 AM   Specimen: Nasopharyngeal(NP) swabs in vial transport medium  Result Value Ref Range Status   SARS Coronavirus 2 by RT PCR NEGATIVE NEGATIVE Final    Comment: (NOTE) SARS-CoV-2 target nucleic acids are NOT DETECTED.  The SARS-CoV-2  RNA is generally detectable in upper respiratory specimens during the acute phase of infection. The lowest concentration of SARS-CoV-2 viral copies this assay can detect is 138 copies/mL. A negative result does not preclude SARS-Cov-2 infection and should not be used as the sole basis for treatment or other patient management decisions. A negative result may occur with  improper specimen collection/handling, submission of specimen  other than nasopharyngeal swab, presence of viral mutation(s) within the areas targeted by this assay, and inadequate number of viral copies(<138 copies/mL). A negative result must be combined with clinical observations, patient history, and epidemiological information. The expected result is Negative.  Fact Sheet for Patients:  EntrepreneurPulse.com.au  Fact Sheet for Healthcare Providers:  IncredibleEmployment.be  This test is no t yet approved or cleared by the [place] FDA and  has been authorized for detection and/or diagnosis of SARS-CoV-2 by FDA under an Emergency Use Authorization (EUA). This EUA will remain  in effect (meaning this test can be used) for the duration of the COVID-19 declaration under Section 564(b)(1) of the Act, 21 U.S.C.section 360bbb-3(b)(1), unless the authorization is terminated  or revoked sooner.       Influenza A by PCR NEGATIVE NEGATIVE Final   Influenza B by PCR NEGATIVE NEGATIVE Final    Comment: (NOTE) The Xpert Xpress SARS-CoV-2/FLU/RSV plus assay is intended as an aid in the diagnosis of influenza from Nasopharyngeal swab specimens and should not be used as a sole basis for treatment. Nasal washings and aspirates are unacceptable for Xpert Xpress SARS-CoV-2/FLU/RSV testing.  Fact Sheet for Patients: EntrepreneurPulse.com.au  Fact Sheet for Healthcare Providers: IncredibleEmployment.be  This test is not yet approved or cleared by the [place] FDA and has been authorized for detection and/or diagnosis of SARS-CoV-2 by FDA under an Emergency Use Authorization (EUA). This EUA will remain in effect (meaning this test can be used) for the duration of the COVID-19 declaration under Section 564(b)(1) of the Act, 21 U.S.C. section 360bbb-3(b)(1), unless the authorization is terminated or revoked.  Performed at [facility], [place] [address], [place],  [place] [address]   Blood Cultures (routine x 2)     Status: None (Preliminary result)   Collection Time: 10/05/21 10:42 AM   Specimen: BLOOD RIGHT FOREARM  Result Value Ref Range Status   Specimen Description BLOOD RIGHT FOREARM  Final   Special Requests   Final    BOTTLES DRAWN AEROBIC AND ANAEROBIC Blood Culture adequate volume   Culture   Final    NO GROWTH 3 DAYS Performed at [facility], [place] [address] [place], [place] [address]    Report Status PENDING  Incomplete  Urine Culture     Status: Abnormal   Collection Time: 10/05/21 12:47 PM   Specimen: Urine, Clean Catch  Result Value Ref Range Status   Specimen Description URINE, CLEAN CATCH  Final   Special Requests   Final    NONE Performed at [facility], [place] [address] [place],  [address]    Culture MULTIPLE SPECIES PRESENT, SUGGEST RECOLLECTION (A)  Final   Report Status 10/06/2021 FINAL  Final  Blood Cultures (routine x 2)     Status: None (Preliminary result)   Collection Time: 10/06/21  8:40 AM   Specimen: BLOOD LEFT HAND  Result Value Ref Range Status   Specimen Description BLOOD LEFT HAND  Final   Special Requests   Final    BOTTLES DRAWN AEROBIC AND ANAEROBIC Blood Culture results may not be  optimal due to an inadequate volume of blood received in culture bottles   Culture  Setup Time   Final    GRAM POSITIVE COCCI AEROBIC BOTTLE ONLY CRITICAL RESULT CALLED TO, READ BACK BY AND VERIFIED WITH: PHARMD GREG ABBOTT 10/07/21@6 :13 BY TW Performed at [facility], [place] [address], [place], [place] [address]    Culture GRAM POSITIVE COCCI  Final   Report Status PENDING  Incomplete  Blood Culture ID Panel (Reflexed)     Status: Abnormal   Collection Time: 10/06/21  8:40 AM  Result Value Ref Range Status   Enterococcus faecalis NOT DETECTED NOT DETECTED Final   Enterococcus Faecium NOT DETECTED NOT DETECTED Final   Listeria monocytogenes NOT DETECTED NOT DETECTED Final   Staphylococcus species  DETECTED (A) NOT DETECTED Final    Comment: CRITICAL RESULT CALLED TO, READ BACK BY AND VERIFIED WITH: PHARMD GREG ABBOTT 10/07/21@6 :13 BY TW    Staphylococcus aureus (BCID) NOT DETECTED NOT DETECTED Final   Staphylococcus epidermidis NOT DETECTED NOT DETECTED Final   Staphylococcus lugdunensis NOT DETECTED NOT DETECTED Final   Streptococcus species NOT DETECTED NOT DETECTED Final   Streptococcus agalactiae NOT DETECTED NOT DETECTED Final   Streptococcus pneumoniae NOT DETECTED NOT DETECTED Final   Streptococcus pyogenes NOT DETECTED NOT DETECTED Final   A.calcoaceticus-baumannii NOT DETECTED NOT DETECTED Final   Bacteroides fragilis NOT DETECTED NOT DETECTED Final   Enterobacterales NOT DETECTED NOT DETECTED Final   Enterobacter cloacae complex NOT DETECTED NOT DETECTED Final   Escherichia coli NOT DETECTED NOT DETECTED Final   Klebsiella aerogenes NOT DETECTED NOT DETECTED Final   Klebsiella oxytoca NOT DETECTED NOT DETECTED Final   Klebsiella pneumoniae NOT DETECTED NOT DETECTED Final   Proteus species NOT DETECTED NOT DETECTED Final   Salmonella species NOT DETECTED NOT DETECTED Final   Serratia marcescens NOT DETECTED NOT DETECTED Final   Haemophilus influenzae NOT DETECTED NOT DETECTED Final   Neisseria meningitidis NOT DETECTED NOT DETECTED Final   Pseudomonas aeruginosa NOT DETECTED NOT DETECTED Final   Stenotrophomonas maltophilia NOT DETECTED NOT DETECTED Final   Candida albicans NOT DETECTED NOT DETECTED Final   Candida auris NOT DETECTED NOT DETECTED Final   Candida glabrata NOT DETECTED NOT DETECTED Final   Candida krusei NOT DETECTED NOT DETECTED Final   Candida parapsilosis NOT DETECTED NOT DETECTED Final   Candida tropicalis NOT DETECTED NOT DETECTED Final   Cryptococcus neoformans/gattii NOT DETECTED NOT DETECTED Final    Comment: Performed at [facility], [place]. [address], [place], [place] [address]     Time coordinating discharge: Over 30  minutes  SIGNED:   Si Jachim J [place] DO  [facility] 10/08/2021, 9:20 AM

## 2021-10-10 LAB — CULTURE, BLOOD (ROUTINE X 2)
Culture: NO GROWTH
Special Requests: ADEQUATE

## 2021-10-13 ENCOUNTER — Other Ambulatory Visit: Payer: Self-pay | Admitting: Internal Medicine

## 2021-10-14 LAB — RENAL PROFILE WITH ESTIMATED GFR
Albumin: 3.8 g/dL (ref 3.6–5.1)
BUN/Creatinine Ratio: 13 (calc) (ref 6–22)
BUN: 34 mg/dL — ABNORMAL HIGH (ref 7–25)
CO2: 21 mmol/L (ref 20–32)
Calcium: 8.8 mg/dL (ref 8.6–10.3)
Chloride: 115 mmol/L — ABNORMAL HIGH (ref 98–110)
Creat: 2.64 mg/dL — ABNORMAL HIGH (ref 0.60–1.29)
Glucose, Bld: 61 mg/dL — ABNORMAL LOW (ref 65–99)
Phosphorus: 3.8 mg/dL (ref 2.5–4.5)
Potassium: 4.2 mmol/L (ref 3.5–5.3)
Sodium: 144 mmol/L (ref 135–146)
eGFR: 30 mL/min/{1.73_m2} — ABNORMAL LOW (ref >=60)

## 2021-10-14 LAB — EXTRA LAV TOP TUBE

## 2022-01-31 ENCOUNTER — Other Ambulatory Visit: Payer: Self-pay | Admitting: Internal Medicine

## 2022-02-01 LAB — COMPLETE METABOLIC PANEL WITH GFR
AG Ratio: 1.2 (calc) (ref 1.0–2.5)
ALT: 17 U/L (ref 9–46)
AST: 21 U/L (ref 10–40)
Albumin: 4 g/dL (ref 3.6–5.1)
Alkaline phosphatase (APISO): 79 U/L (ref 36–130)
BUN/Creatinine Ratio: 16 (calc) (ref 6–22)
BUN: 45 mg/dL — ABNORMAL HIGH (ref 7–25)
CO2: 19 mmol/L — ABNORMAL LOW (ref 20–32)
Calcium: 8.6 mg/dL (ref 8.6–10.3)
Chloride: 114 mmol/L — ABNORMAL HIGH (ref 98–110)
Creat: 2.88 mg/dL — ABNORMAL HIGH (ref 0.60–1.29)
Globulin: 3.3 g/dL (calc) (ref 1.9–3.7)
Glucose, Bld: 95 mg/dL (ref 65–99)
Potassium: 4.7 mmol/L (ref 3.5–5.3)
Sodium: 143 mmol/L (ref 135–146)
Total Bilirubin: 0.2 mg/dL (ref 0.2–1.2)
Total Protein: 7.3 g/dL (ref 6.1–8.1)
eGFR: 27 mL/min/{1.73_m2} — ABNORMAL LOW (ref >=60)

## 2022-02-01 LAB — CBC
HCT: 35.4 % — ABNORMAL LOW (ref 38.5–50.0)
Hemoglobin: 11.6 g/dL — ABNORMAL LOW (ref 13.2–17.1)
MCH: 30.4 pg (ref 27.0–33.0)
MCHC: 32.8 g/dL (ref 32.0–36.0)
MCV: 92.7 fL (ref 80.0–100.0)
MPV: 10.3 fL (ref 7.5–12.5)
Platelets: 172 10*3/uL (ref 140–400)
RBC: 3.82 10*6/uL — ABNORMAL LOW (ref 4.20–5.80)
RDW: 13.1 % (ref 11.0–15.0)
WBC: 3.7 10*3/uL — ABNORMAL LOW (ref 3.8–10.8)

## 2022-02-01 LAB — PSA: PSA: 0.51 ng/mL (ref <=4.00)

## 2022-02-01 LAB — LIPID PANEL
Cholesterol: 130 mg/dL (ref <200)
HDL: 36 mg/dL — ABNORMAL LOW (ref >=40)
LDL Cholesterol (Calc): 79 mg/dL (calc)
Non-HDL Cholesterol (Calc): 94 mg/dL (calc) (ref <130)
Total CHOL/HDL Ratio: 3.6 (calc) (ref <5.0)
Triglycerides: 69 mg/dL (ref <150)

## 2022-02-01 LAB — TSH: TSH: 1.49 mIU/L (ref 0.40–4.50)

## 2022-02-01 LAB — VITAMIN D 25 HYDROXY (VIT D DEFICIENCY, FRACTURES): Vit D, 25-Hydroxy: 96 ng/mL (ref 30–100)

## 2022-05-16 IMAGING — CT CT HEAD W/O CM
3 series · 14 of 47 positions shown, 16 images · non-contrast
Comparison: None.

CLINICAL DATA: Mental status change.



[Series 3: head 5.0 h30s · axial · 0.47mm/px · z∈[-85,+65]mm · 8 of 36 slices shown, 10 images]
[im 3/36  brain]
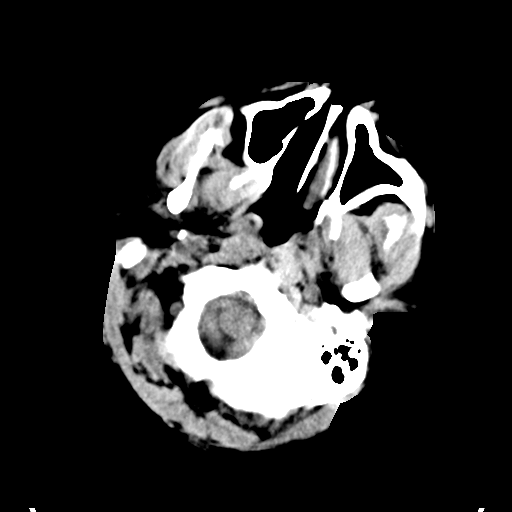
[im 3/36  bone]
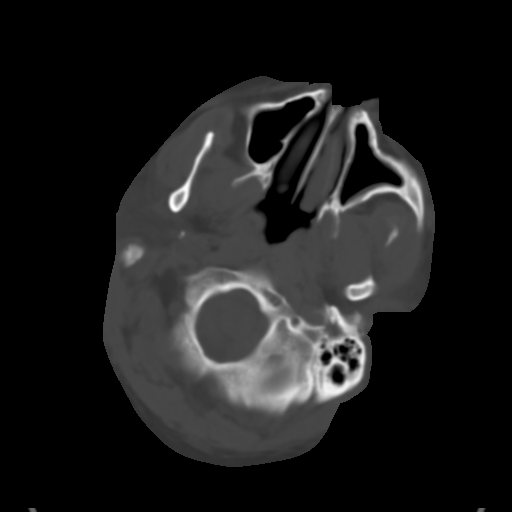
[im 8/36  brain]
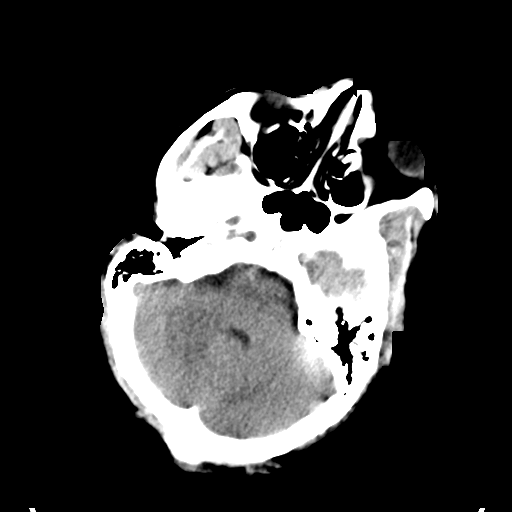
[im 11/36  brain]
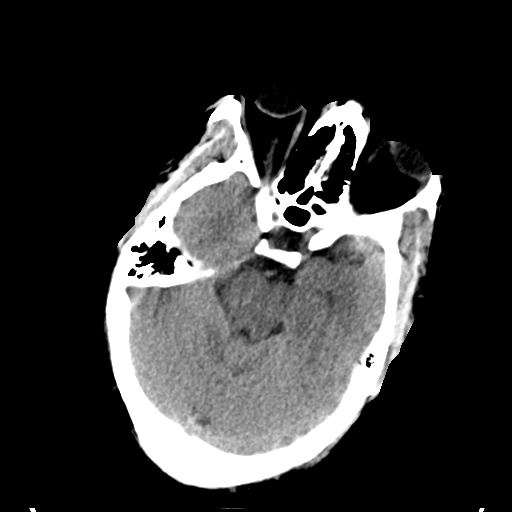
[im 16/36  brain]
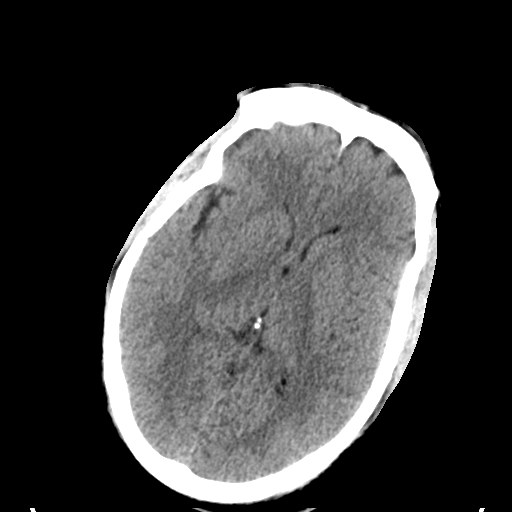
[im 20/36  brain]
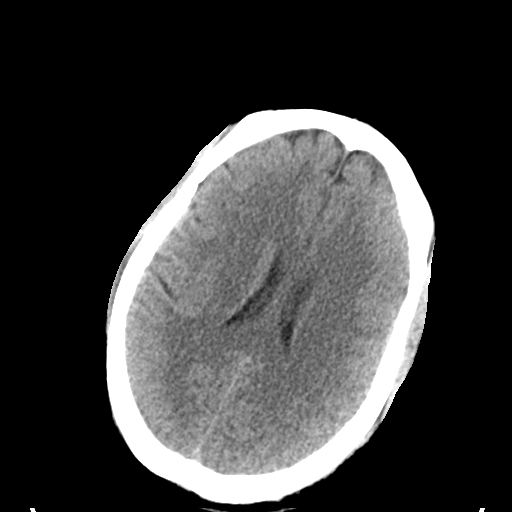
[im 20/36  bone]
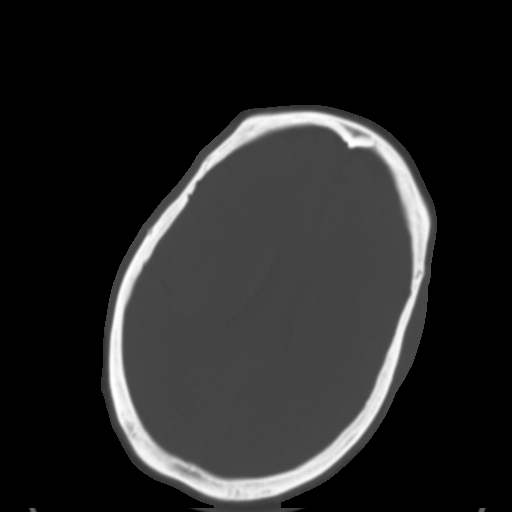
[im 25/36  brain]
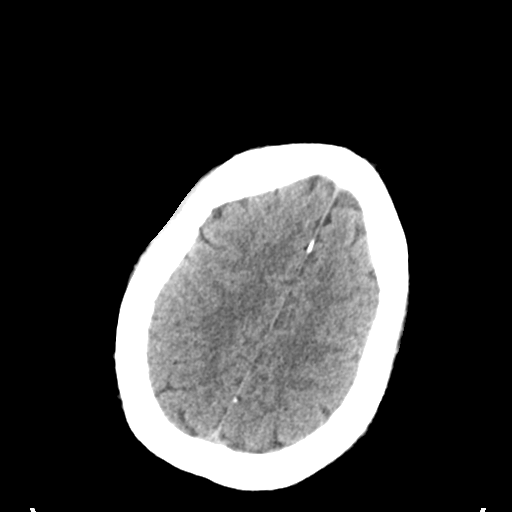
[im 28/36  brain]
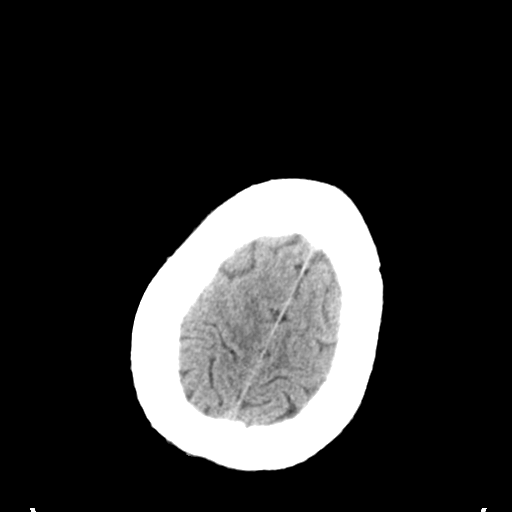
[im 33/36  brain]
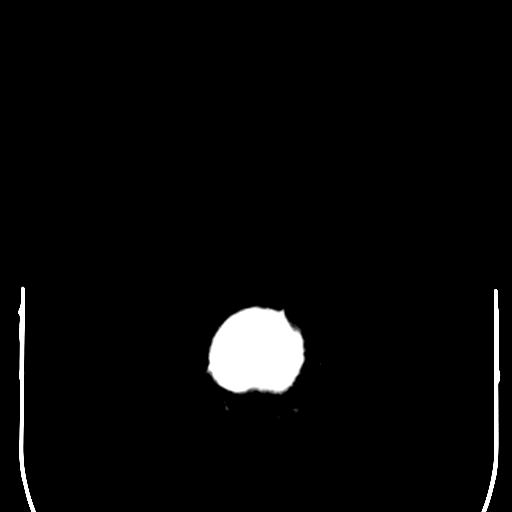

[Series 5: head 3.0 mpr cor · coronal · 0.34mm/px · 3 of 82 slices shown]
[im 28/82  brain]
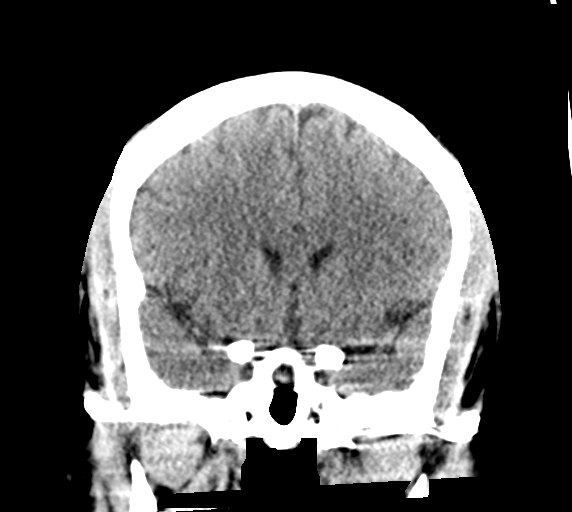
[im 37/82  brain]
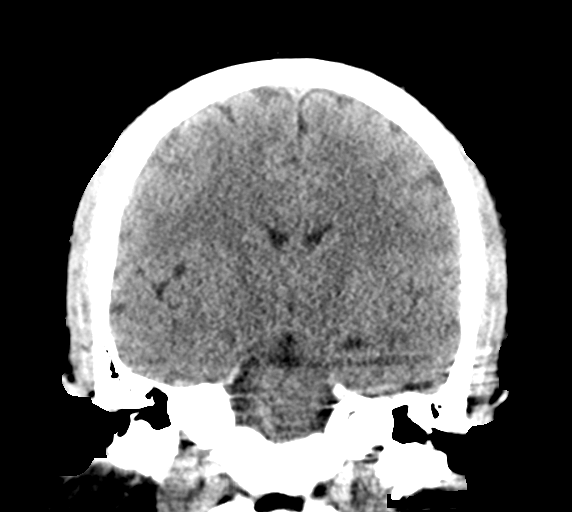
[im 46/82  brain]
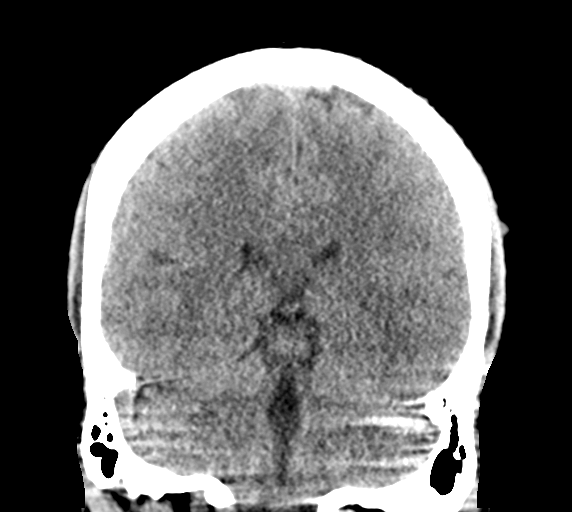

[Series 6: head 3.0 mpr sag · sagittal · 0.35mm/px · 3 of 67 slices shown]
[im 23/67  brain]
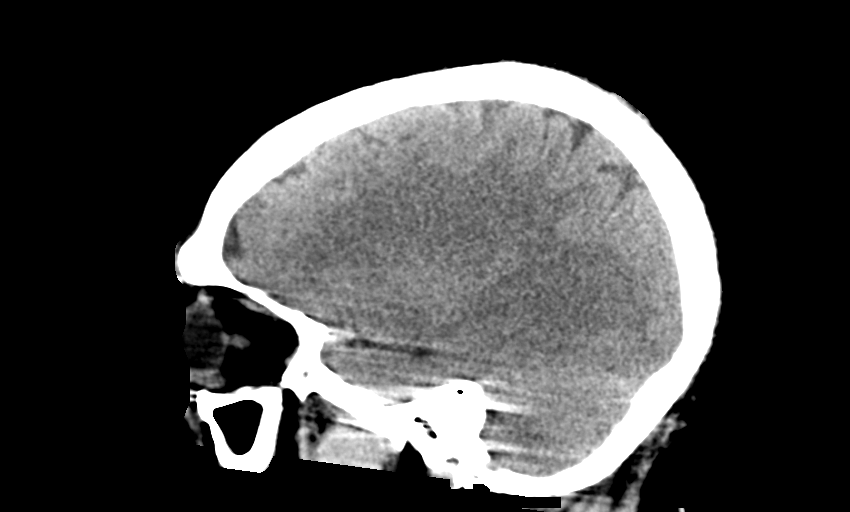
[im 34/67  brain]
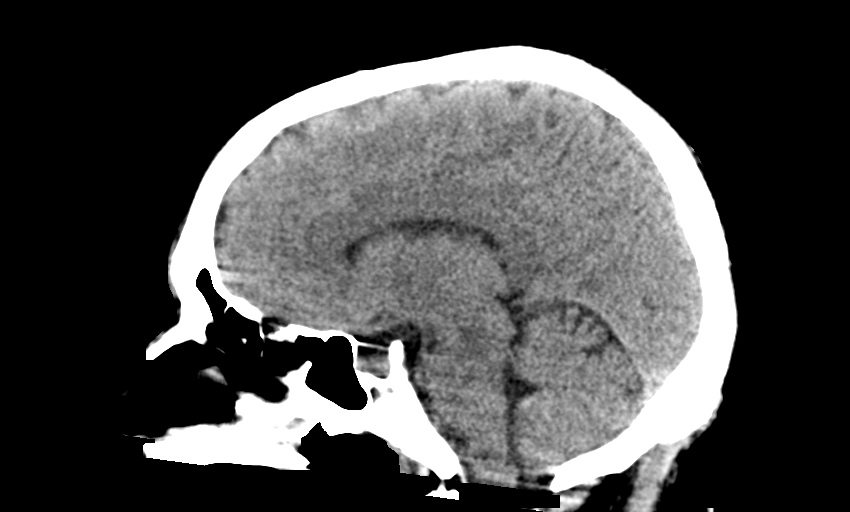
[im 45/67  brain]
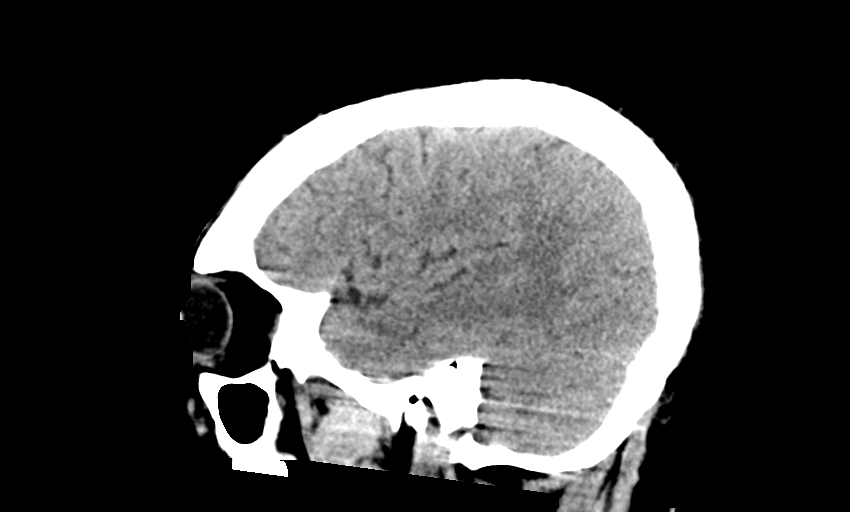

[14 of 47 positions shown; findings below may reference images not displayed]

FINDINGS: Brain: No evidence of acute infarction, hemorrhage, hydrocephalus,
extra-axial collection or mass lesion/mass effect.

Vascular: Negative for hyperdense vessel

Skull: Negative

Sinuses/Orbits: Mucosal edema left maxillary sinus. Remaining
sinuses clear. Negative orbit

Other: None
IMPRESSION: Negative CT head

## 2023-02-01 ENCOUNTER — Other Ambulatory Visit: Payer: Self-pay | Admitting: Internal Medicine

## 2023-02-02 LAB — TSH: TSH: 1.36 mIU/L (ref 0.40–4.50)

## 2023-02-02 LAB — COMPLETE METABOLIC PANEL WITH GFR
AG Ratio: 1.3 (calc) (ref 1.0–2.5)
ALT: 21 U/L (ref 9–46)
AST: 25 U/L (ref 10–40)
Albumin: 4.2 g/dL (ref 3.6–5.1)
Alkaline phosphatase (APISO): 86 U/L (ref 36–130)
BUN/Creatinine Ratio: 13 (calc) (ref 6–22)
BUN: 35 mg/dL — ABNORMAL HIGH (ref 7–25)
CO2: 22 mmol/L (ref 20–32)
Calcium: 9 mg/dL (ref 8.6–10.3)
Chloride: 109 mmol/L (ref 98–110)
Creat: 2.64 mg/dL — ABNORMAL HIGH (ref 0.60–1.29)
Globulin: 3.3 g/dL (calc) (ref 1.9–3.7)
Glucose, Bld: 131 mg/dL — ABNORMAL HIGH (ref 65–99)
Potassium: 4.4 mmol/L (ref 3.5–5.3)
Sodium: 142 mmol/L (ref 135–146)
Total Bilirubin: 0.4 mg/dL (ref 0.2–1.2)
Total Protein: 7.5 g/dL (ref 6.1–8.1)
eGFR: 30 mL/min/{1.73_m2} — ABNORMAL LOW (ref >=60)

## 2023-02-02 LAB — CBC
HCT: 37.4 % — ABNORMAL LOW (ref 38.5–50.0)
Hemoglobin: 12.2 g/dL — ABNORMAL LOW (ref 13.2–17.1)
MCH: 29.1 pg (ref 27.0–33.0)
MCHC: 32.6 g/dL (ref 32.0–36.0)
MCV: 89.3 fL (ref 80.0–100.0)
MPV: 10.4 fL (ref 7.5–12.5)
Platelets: 188 10*3/uL (ref 140–400)
RBC: 4.19 10*6/uL — ABNORMAL LOW (ref 4.20–5.80)
RDW: 13 % (ref 11.0–15.0)
WBC: 3.5 10*3/uL — ABNORMAL LOW (ref 3.8–10.8)

## 2023-02-02 LAB — LIPID PANEL
Cholesterol: 132 mg/dL (ref <200)
HDL: 36 mg/dL — ABNORMAL LOW (ref >=40)
LDL Cholesterol (Calc): 76 mg/dL (calc)
Non-HDL Cholesterol (Calc): 96 mg/dL (calc) (ref <130)
Total CHOL/HDL Ratio: 3.7 (calc) (ref <5.0)
Triglycerides: 116 mg/dL (ref <150)

## 2023-02-02 LAB — VITAMIN D 25 HYDROXY (VIT D DEFICIENCY, FRACTURES): Vit D, 25-Hydroxy: 87 ng/mL (ref 30–100)

## 2023-02-02 LAB — PSA: PSA: 0.54 ng/mL (ref <=4.00)
